# Patient Record
Sex: Female | Born: 1985 | Race: White | Hispanic: No | Marital: Married | Smoking: Former smoker
Health system: Southern US, Community
[De-identification: ages and names within clinical notes are randomized; demographics above are authoritative.]

## PROBLEM LIST (undated history)

## (undated) ENCOUNTER — Inpatient Hospital Stay (HOSPITAL_COMMUNITY): Payer: Self-pay

## (undated) DIAGNOSIS — E282 Polycystic ovarian syndrome: Secondary | ICD-10-CM

## (undated) DIAGNOSIS — IMO0002 Reserved for concepts with insufficient information to code with codable children: Secondary | ICD-10-CM

## (undated) DIAGNOSIS — F329 Major depressive disorder, single episode, unspecified: Secondary | ICD-10-CM

## (undated) DIAGNOSIS — T8859XA Other complications of anesthesia, initial encounter: Secondary | ICD-10-CM

## (undated) DIAGNOSIS — J45990 Exercise induced bronchospasm: Secondary | ICD-10-CM

## (undated) DIAGNOSIS — Z98891 History of uterine scar from previous surgery: Secondary | ICD-10-CM

## (undated) DIAGNOSIS — R87629 Unspecified abnormal cytological findings in specimens from vagina: Secondary | ICD-10-CM

## (undated) DIAGNOSIS — F32A Depression, unspecified: Secondary | ICD-10-CM

## (undated) DIAGNOSIS — T4145XA Adverse effect of unspecified anesthetic, initial encounter: Secondary | ICD-10-CM

## (undated) DIAGNOSIS — N83209 Unspecified ovarian cyst, unspecified side: Secondary | ICD-10-CM

## (undated) DIAGNOSIS — F419 Anxiety disorder, unspecified: Secondary | ICD-10-CM

## (undated) HISTORY — DX: Unspecified ovarian cyst, unspecified side: N83.209

## (undated) HISTORY — DX: Anxiety disorder, unspecified: F41.9

## (undated) HISTORY — DX: Major depressive disorder, single episode, unspecified: F32.9

## (undated) HISTORY — DX: Depression, unspecified: F32.A

## (undated) HISTORY — DX: Reserved for concepts with insufficient information to code with codable children: IMO0002

## (undated) HISTORY — DX: Polycystic ovarian syndrome: E28.2

---
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
## 2004-09-19 DIAGNOSIS — R87619 Unspecified abnormal cytological findings in specimens from cervix uteri: Secondary | ICD-10-CM

## 2004-09-19 DIAGNOSIS — IMO0002 Reserved for concepts with insufficient information to code with codable children: Secondary | ICD-10-CM

## 2004-09-19 HISTORY — DX: Unspecified abnormal cytological findings in specimens from cervix uteri: R87.619

## 2004-09-19 HISTORY — DX: Reserved for concepts with insufficient information to code with codable children: IMO0002

## 2004-09-19 HISTORY — PX: COLPOSCOPY: SHX161

## 2009-09-19 DIAGNOSIS — E282 Polycystic ovarian syndrome: Secondary | ICD-10-CM

## 2009-09-19 HISTORY — DX: Polycystic ovarian syndrome: E28.2

## 2010-09-19 DIAGNOSIS — N83209 Unspecified ovarian cyst, unspecified side: Secondary | ICD-10-CM

## 2010-09-19 HISTORY — DX: Unspecified ovarian cyst, unspecified side: N83.209

## 2011-11-30 ENCOUNTER — Other Ambulatory Visit: Payer: Self-pay | Admitting: Family Medicine

## 2011-11-30 DIAGNOSIS — G43909 Migraine, unspecified, not intractable, without status migrainosus: Secondary | ICD-10-CM

## 2011-12-01 ENCOUNTER — Other Ambulatory Visit: Payer: Self-pay

## 2011-12-02 ENCOUNTER — Ambulatory Visit
Admission: RE | Admit: 2011-12-02 | Discharge: 2011-12-02 | Disposition: A | Discharge status: Home / Self Care | Payer: BC Managed Care – PPO | Source: Ambulatory Visit | Attending: Family Medicine | Admitting: Family Medicine

## 2011-12-02 DIAGNOSIS — G43909 Migraine, unspecified, not intractable, without status migrainosus: Secondary | ICD-10-CM

## 2012-10-15 IMAGING — CT CT HEAD W/O CM
2 series · 16 of 30 positions shown, 20 images · non-contrast
Comparison: None.

CLINICAL DATA: Headache.  Left retro-orbital pain.  Vertigo.
Blurred vision.

CT HEAD WITHOUT CONTRAST
TECHNIQUE: Contiguous axial images were obtained from the base of
the skull through the vertex without contrast.

[Series 3: head bone · axial · 0.49mm/px · z∈[+37,+79]mm · 3 of 28 slices shown]
[im 2/28  bone]
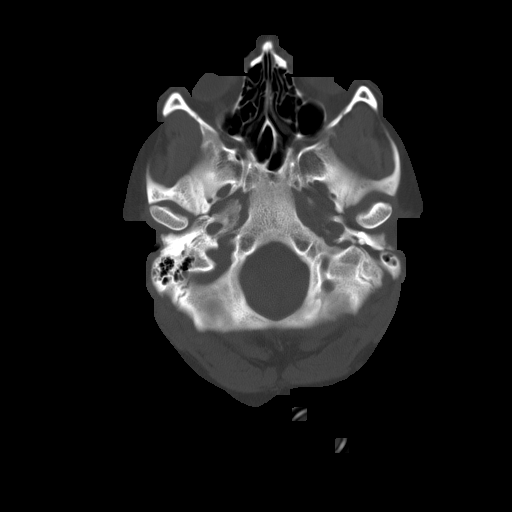
[im 6/28  bone]
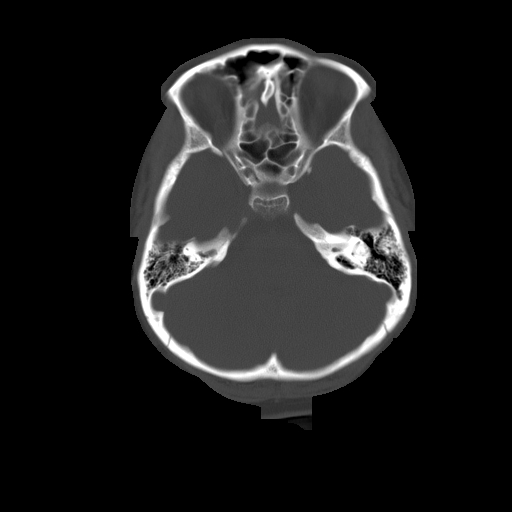
[im 10/28  bone]
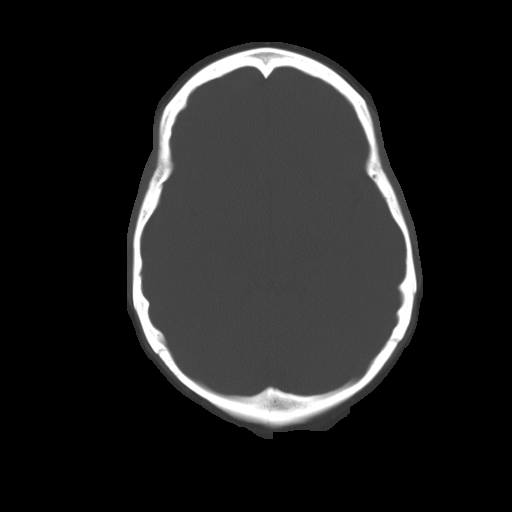

[Series 32: 3d filtered head w/o · axial · non-contrast · 0.49mm/px · z∈[+37,+164]mm · 13 of 28 slices shown, 17 images]
[im 2/28  brain]
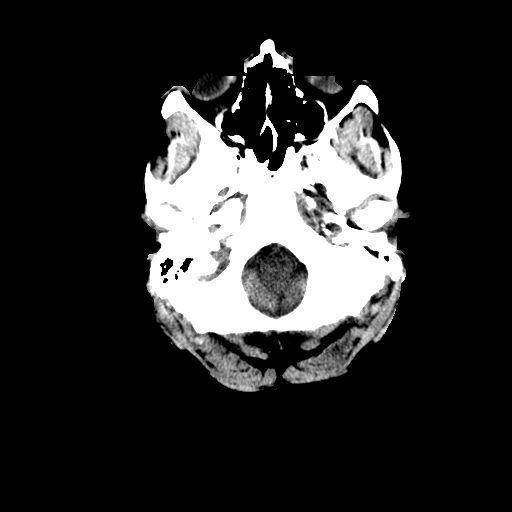
[im 2/28  bone]
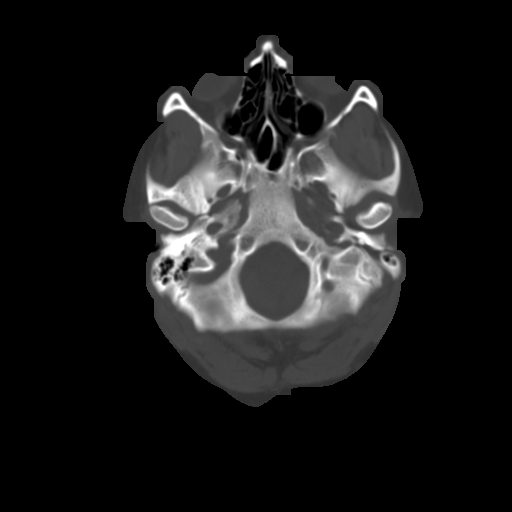
[im 4/28  brain]
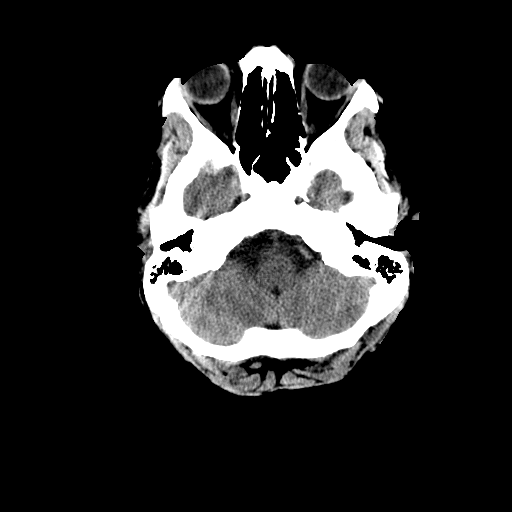
[im 6/28  brain]
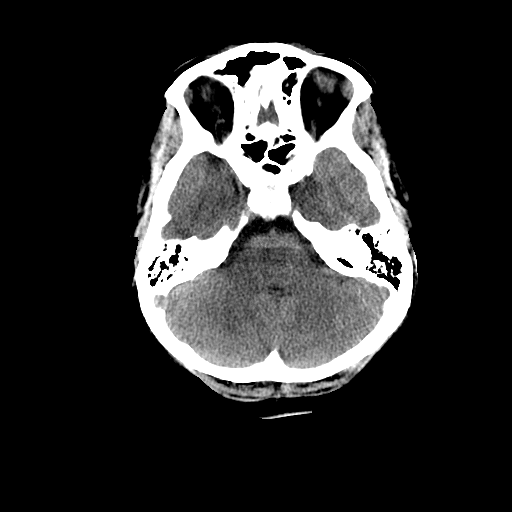
[im 8/28  brain]
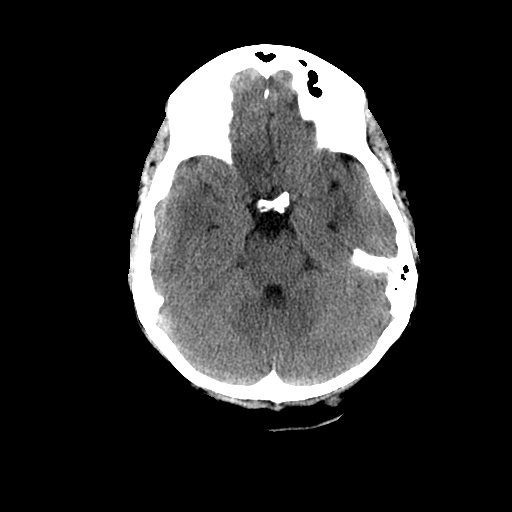
[im 10/28  brain]
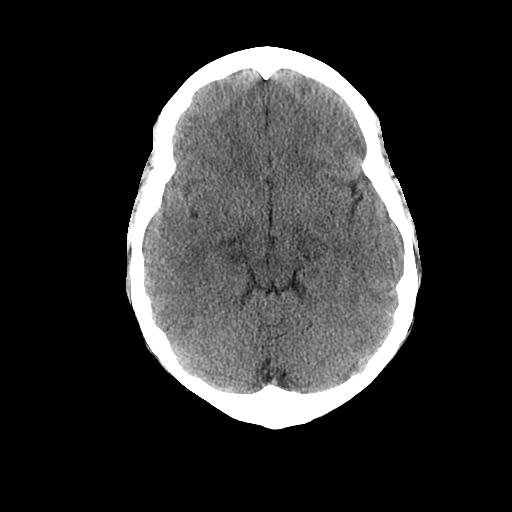
[im 10/28  bone]
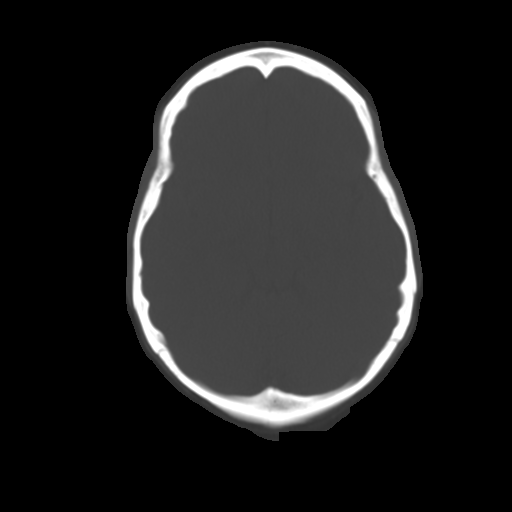
[im 12/28  brain]
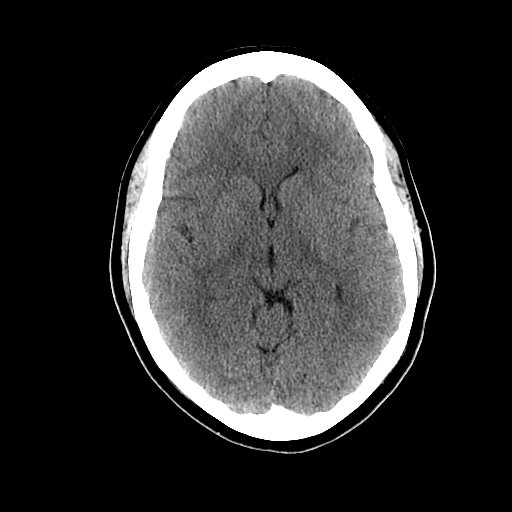
[im 14/28  brain]
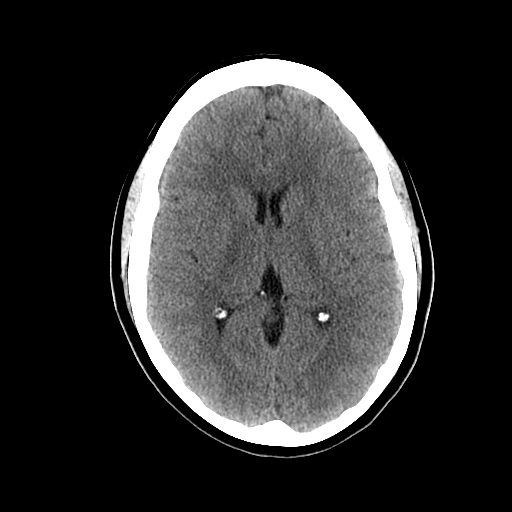
[im 16/28  brain]
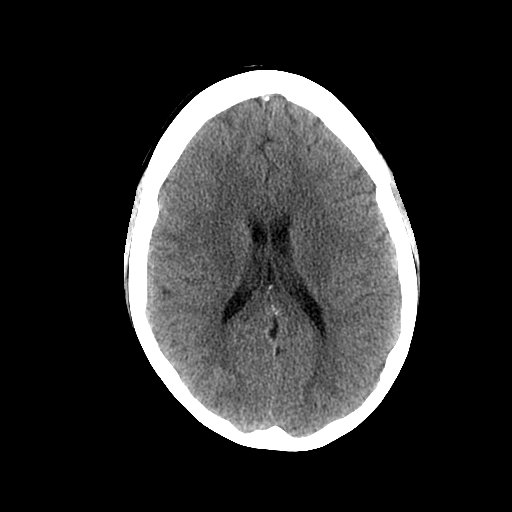
[im 18/28  brain]
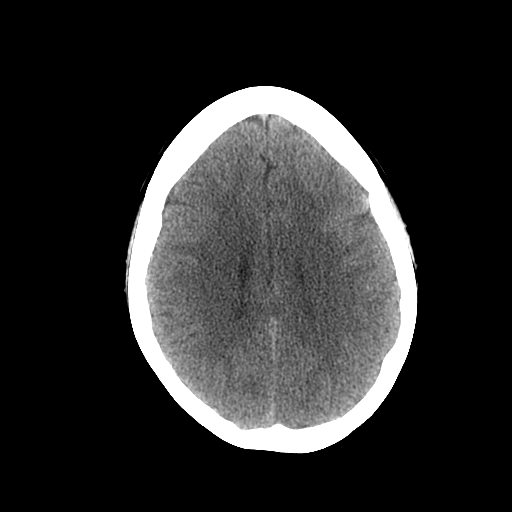
[im 18/28  bone]
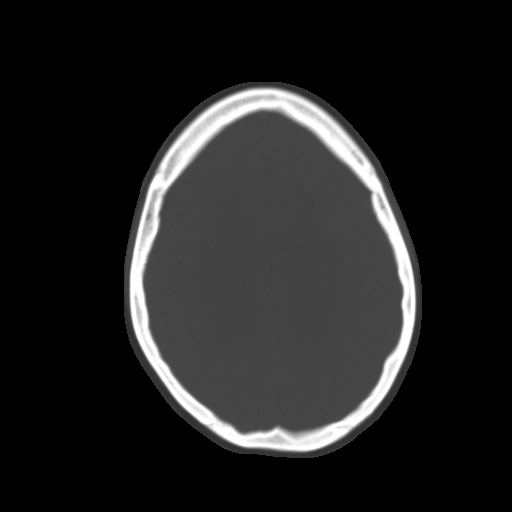
[im 20/28  brain]
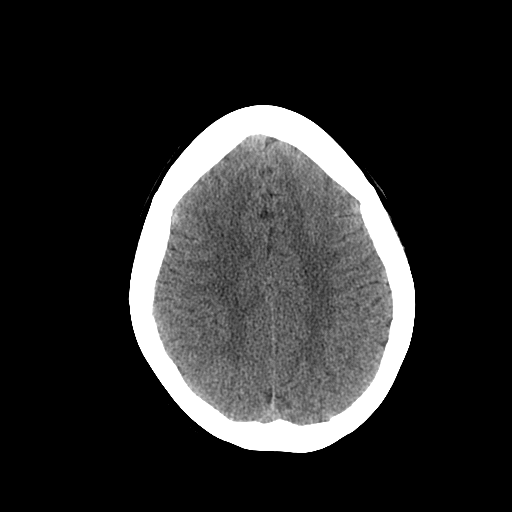
[im 22/28  brain]
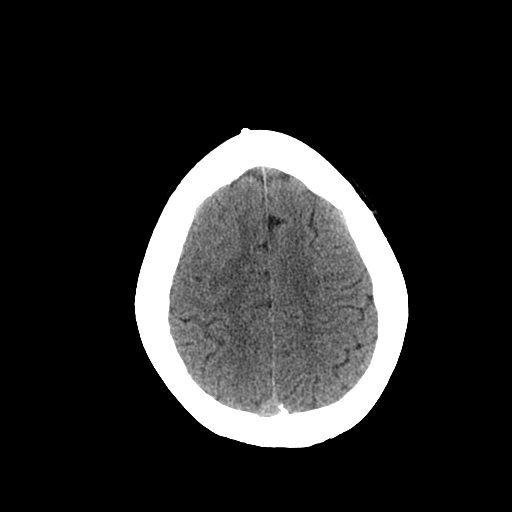
[im 24/28  brain]
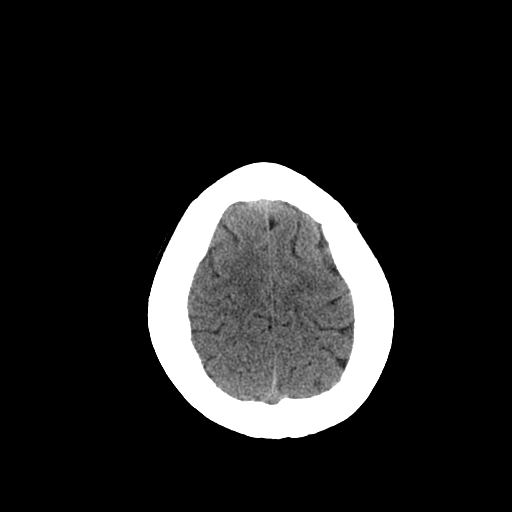
[im 26/28  brain]
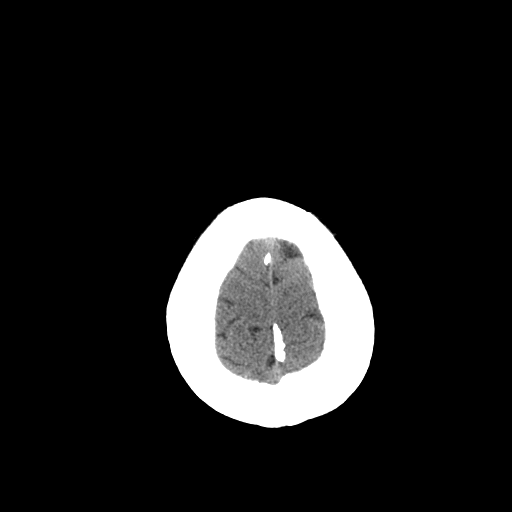
[im 26/28  bone]
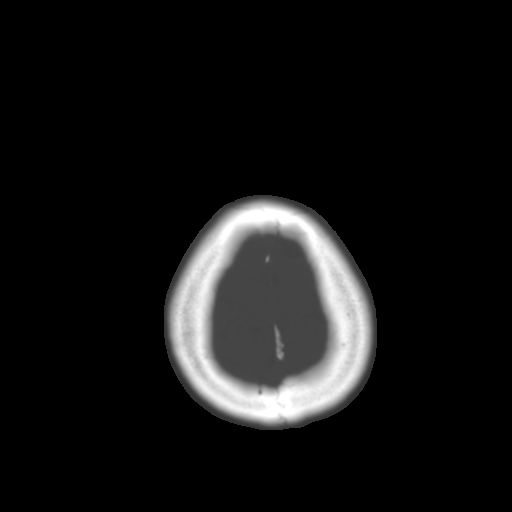

[16 of 30 positions shown; findings below may reference images not displayed]

FINDINGS: The brain has a normal appearance without evidence of
malformation, atrophy, old or acute infarction, mass lesion,
hemorrhage, hydrocephalus or extra-axial collection.  The calvarium
is unremarkable.  Sinuses, middle ears and mastoids are clear.  No
orbital region abnormality is seen.
IMPRESSION: Normal head CT

## 2013-06-21 ENCOUNTER — Telehealth: Payer: Self-pay | Admitting: Emergency Medicine

## 2013-06-21 NOTE — Telephone Encounter (Signed)
Patient calling to schedule New GYN appointment as she is new to the area. Transferred to this RN from reception as patient c/o ongoing vaginal bleeding as reason for visit.   She has a hx of PCOS and is currently trying to conceive with husband. States that since 05/21/13 she has been having spotting. Not heavy at all per patient and is "more of an annoyance", using no more than a panty liner daily.  Denies any contraception use.  Last normal period patient states mid-august but is unsure. Patient states that there is "no chance of pregnancy at all" because has not been sexually active because of the spotting. Advised to complete home pregnancy test just to ensure.   Provided multiple options for new patient appointments and patient declines earlier office visit.  Office visit scheduled per patients date request. Advised to call back if worsens or decides she would like to be seen earlier. Patient agreeable.   Routing to provider for review.

## 2013-07-11 ENCOUNTER — Encounter: Payer: Self-pay | Admitting: Nurse Practitioner

## 2013-07-11 ENCOUNTER — Ambulatory Visit (INDEPENDENT_AMBULATORY_CARE_PROVIDER_SITE_OTHER): Payer: BC Managed Care – PPO | Admitting: Nurse Practitioner

## 2013-07-11 VITALS — BP 110/62 | HR 80 | Resp 16 | Ht 68.5 in | Wt 266.0 lb

## 2013-07-11 DIAGNOSIS — Z01419 Encounter for gynecological examination (general) (routine) without abnormal findings: Secondary | ICD-10-CM

## 2013-07-11 DIAGNOSIS — E282 Polycystic ovarian syndrome: Secondary | ICD-10-CM

## 2013-07-11 DIAGNOSIS — N938 Other specified abnormal uterine and vaginal bleeding: Secondary | ICD-10-CM

## 2013-07-11 DIAGNOSIS — Z0000 Encounter for general adult medical examination without abnormal findings: Secondary | ICD-10-CM

## 2013-07-11 DIAGNOSIS — N949 Unspecified condition associated with female genital organs and menstrual cycle: Secondary | ICD-10-CM

## 2013-07-11 LAB — HEMOGLOBIN, FINGERSTICK: Hemoglobin, fingerstick: 12.3 g/dL (ref 12.0–16.0)

## 2013-07-11 MED ORDER — ESCITALOPRAM OXALATE 20 MG PO TABS: 20.0000 mg | ORAL_TABLET | Freq: Every day | ORAL | Status: DC

## 2013-07-11 MED ORDER — METFORMIN HCL 500 MG PO TABS: 500.0000 mg | ORAL_TABLET | Freq: Two times a day (BID) | ORAL | Status: DC

## 2013-07-11 MED ORDER — DROSPIRENONE-ETHINYL ESTRADIOL 3-0.03 MG PO TABS: 1.0000 | ORAL_TABLET | Freq: Every day | ORAL | Status: DC

## 2013-07-11 NOTE — Progress Notes (Signed)
Patient ID: Katie Dorsey, female   DOB: Dec 14, 1985, 27 y.o.   MRN: [id number] 27 y.o. G0P0 Married Caucasian Fe here for annual exam.history of PCOS with usual cycles 3-4 times a year. Previous OCP with normal periods. For abut 4-5 years.    LMP 04/12/13 X 7 days of just spotting.  Then no cycle until 05/23/13 with spotting the entire month until regular cycle started Oct. 3 - 21 st. This cycle had cramps, heavy bleeding and PMS. No method of birth control and wants to get pregnant. She is not ready to go for infertility evaluation  Patient's last menstrual period was 06/21/2013.          Sexually active: yes  The current method of family planning is none.    Exercising: yes  Home exercise routine includes walking .75 hrs per day. 2-3 times per week Smoker:  Former, quit in February   Health Maintenance: Pap:  Greater than 1 year, mild dysplasia in 2006 TDaP:  2006 Labs: HB: 12.3 Urine: negative, pH 6.0      UPT: negative   reports that she quit smoking about 8 months ago. Her smoking use included Cigarettes. She has a 3.25 pack-year smoking history. She has never used smokeless tobacco. She reports that she does not drink alcohol or use illicit drugs.  Past Medical History  Diagnosis Date  . PCOS (polycystic ovarian syndrome)     Past Surgical History  Procedure Laterality Date  . Tonsillectomy  1995  . Ganglion cyst excision Right 2004    wrist    Current Outpatient Prescriptions  Medication Sig Dispense Refill  . escitalopram (LEXAPRO) 20 MG tablet Take 20 mg by mouth daily.       No current facility-administered medications for this visit.    Family History  Problem Relation Age of Onset  . Hypertension Mother   . HIV Father   . Colon cancer Maternal Grandmother   . Heart disease Maternal Grandfather   . Diabetes Maternal Uncle   . Diabetes Maternal Uncle     ROS:  Pertinent items are noted in HPI.  Otherwise, a comprehensive ROS was negative.  Exam:   BP 110/62  Pulse  80  Resp 16  Ht 5' 8.5" (1.74 m)  Wt 266 lb (120.657 kg)  BMI 39.85 kg/m2  LMP 06/21/2013 Height: 5' 8.5" (174 cm)  Ht Readings from Last 3 Encounters:  07/11/13 5' 8.5" (1.74 m)    General appearance: alert, cooperative and appears stated age Head: Normocephalic, without obvious abnormality, atraumatic Neck: no adenopathy, supple, symmetrical, trachea midline and thyroid normal to inspection and palpation Lungs: clear to auscultation bilaterally Breasts: normal appearance, no masses or tenderness Heart: regular rate and rhythm Abdomen: soft, non-tender; no masses,  no organomegaly Extremities: extremities normal, atraumatic, no cyanosis or edema Skin: Skin color, texture, turgor normal. No rashes or lesions Lymph nodes: Cervical, supraclavicular, and axillary nodes normal. No abnormal inguinal nodes palpated Neurologic: Grossly normal   Pelvic: External genitalia:  no lesions              Urethra:  normal appearing urethra with no masses, tenderness or lesions              Bartholin's and Skene's: normal                 Vagina: normal appearing vagina with normal color and discharge, no lesions              Cervix: anteverted  Pap taken: yes Bimanual Exam:  Uterus:  normal size, contour, position, consistency, mobility, non-tender              Adnexa: no mass, fullness, tenderness               Rectovaginal: Confirms               Anus:  normal sphincter tone, no lesions  A:  Well Woman with normal exam  History of PCOS   History of DUB for 2 months  Obesity  Situational anxiety and depression    P:   Pap smear as per guidelines  Will schedule some fasting labs  Schedule PUS after discussion with Dr. Rica Koyanagi on [place] today and take as directed to initiate regular menses for several months and then consider pregnancy.  She will restart on Metformin after her fasting labs are done - encouraged to take in the evening  Counseled on breast self exam,  adequate intake of calcium and vitamin D, diet and exercise return annually or prn  An After Visit Summary was printed and given to the patient.

## 2013-07-11 NOTE — Patient Instructions (Addendum)
General topics  Next pap or exam is  due in 1 year Take a Women's multivitamin Take 1200 mg. of calcium daily - prefer dietary If any concerns in interim to call back  Breast Self-Awareness Practicing breast self-awareness may pick up problems early, prevent significant medical complications, and possibly save your life. By practicing breast self-awareness, you can become familiar with how your breasts look and feel and if your breasts are changing. This allows you to notice changes early. It can also offer you some reassurance that your breast health is good. One way to learn what is normal for your breasts and whether your breasts are changing is to do a breast self-exam. If you find a lump or something that was not present in the past, it is best to contact your caregiver right away. Other findings that should be evaluated by your caregiver include nipple discharge, especially if it is bloody; skin changes or reddening; areas where the skin seems to be pulled in (retracted); or new lumps and bumps. Breast pain is seldom associated with cancer (malignancy), but should also be evaluated by a caregiver. BREAST SELF-EXAM The best time to examine your breasts is 5 7 days after your menstrual period is over.  ExitCare Patient Information 2013 Exit[employer].   Exercise to Stay Healthy Exercise helps you become and stay healthy. EXERCISE IDEAS AND TIPS Choose exercises that:  You enjoy.  Fit into your day. You do not need to exercise really hard to be healthy. You can do exercises at a slow or medium level and stay healthy. You can:  Stretch before and after working out.  Try yoga, Pilates, or tai chi.  Lift weights.  Walk fast, swim, jog, run, climb stairs, bicycle, dance, or rollerskate.  Take aerobic classes. Exercises that burn about 150 calories:  Running 1  miles in 15 minutes.  Playing volleyball for 45 to 60 minutes.  Washing and waxing a car for 45 to 60  minutes.  Playing touch football for 45 minutes.  Walking 1  miles in 35 minutes.  Pushing a stroller 1  miles in 30 minutes.  Playing basketball for 30 minutes.  Raking leaves for 30 minutes.  Bicycling 5 miles in 30 minutes.  Walking 2 miles in 30 minutes.  Dancing for 30 minutes.  Shoveling snow for 15 minutes.  Swimming laps for 20 minutes.  Walking up stairs for 15 minutes.  Bicycling 4 miles in 15 minutes.  Gardening for 30 to 45 minutes.  Jumping rope for 15 minutes.  Washing windows or floors for 45 to 60 minutes. Document Released: 10/08/2010 Document Revised: 11/28/2011 Document Reviewed: 10/08/2010 ExitCare Patient Information 2013 Exit[employer].   Other topics ( that may be useful information):    Sexually Transmitted Disease Sexually transmitted disease (STD) refers to any infection that is passed from person to person during sexual activity. This may happen by way of saliva, semen, blood, vaginal mucus, or urine. Common STDs include:  Gonorrhea.  Chlamydia.  Syphilis.  HIV/AIDS.  Genital herpes.  Hepatitis B and C.  Trichomonas.  Human papillomavirus (HPV).  Pubic lice. CAUSES  An STD may be spread by bacteria, virus, or parasite. A person can get an STD by:  Sexual intercourse with an infected person.  Sharing sex toys with an infected person.  Sharing needles with an infected person.  Having intimate contact with the genitals, mouth, or rectal areas of an infected person. SYMPTOMS  Some people may not have any symptoms, but   they can still pass the infection to others. Different STDs have different symptoms. Symptoms include:  Painful or bloody urination.  Pain in the pelvis, abdomen, vagina, anus, throat, or eyes.  Skin rash, itching, irritation, growths, or sores (lesions). These usually occur in the genital or anal area.  Abnormal vaginal discharge.  Penile discharge in men.  Soft, flesh-colored skin growths in the  genital or anal area.  Fever.  Pain or bleeding during sexual intercourse.  Swollen glands in the groin area.  Yellow skin and eyes (jaundice). This is seen with hepatitis. DIAGNOSIS  To make a diagnosis, your caregiver may:  Take a medical history.  Perform a physical exam.  Take a specimen (culture) to be examined.  Examine a sample of discharge under a microscope.  Perform blood test TREATMENT   Chlamydia, gonorrhea, trichomonas, and syphilis can be cured with antibiotic medicine.  Genital herpes, hepatitis, and HIV can be treated, but not cured, with prescribed medicines. The medicines will lessen the symptoms.  Genital warts from HPV can be treated with medicine or by freezing, burning (electrocautery), or surgery. Warts may come back.  HPV is a virus and cannot be cured with medicine or surgery.However, abnormal areas may be followed very closely by your caregiver and may be removed from the cervix, vagina, or vulva through office procedures or surgery. If your diagnosis is confirmed, your recent sexual partners need treatment. This is true even if they are symptom-free or have a negative culture or evaluation. They should not have sex until their caregiver says it is okay. HOME CARE INSTRUCTIONS  All sexual partners should be informed, tested, and treated for all STDs.  Take your antibiotics as directed. Finish them even if you start to feel better.  Only take over-the-counter or prescription medicines for pain, discomfort, or fever as directed by your caregiver.  Rest.  Eat a balanced diet and drink enough fluids to keep your urine clear or pale yellow.  Do not have sex until treatment is completed and you have followed up with your caregiver. STDs should be checked after treatment.  Keep all follow-up appointments, Pap tests, and blood tests as directed by your caregiver.  Only use latex condoms and water-soluble lubricants during sexual activity. Do not use  petroleum jelly or oils.  Avoid alcohol and illegal drugs.  Get vaccinated for HPV and hepatitis. If you have not received these vaccines in the past, talk to your caregiver about whether one or both might be right for you.  Avoid risky sex practices that can break the skin. The only way to avoid getting an STD is to avoid all sexual activity.Latex condoms and dental dams (for oral sex) will help lessen the risk of getting an STD, but will not completely eliminate the risk. SEEK MEDICAL CARE IF:   You have a fever.  You have any new or worsening symptoms. Document Released: 11/26/2002 Document Revised: 11/28/2011 Document Reviewed: 12/03/2010 [facility] Patient Information 2013 [place].    Domestic Abuse You are being battered or abused if someone close to you hits, pushes, or physically hurts you in any way. You also are being abused if you are forced into activities. You are being sexually abused if you are forced to have sexual contact of any kind. You are being emotionally abused if you are made to feel worthless or if you are constantly threatened. It is important to remember that help is available. No one has the right to abuse you. PREVENTION OF FURTHER  ABUSE  Learn the warning signs of danger. This varies with situations but may include: the use of alcohol, threats, isolation from friends and family, or forced sexual contact. Leave if you feel that violence is going to occur.  If you are attacked or beaten, report it to the police so the abuse is documented. You do not have to press charges. The police can protect you while you or the attackers are leaving. Get the officer's name and badge number and a copy of the report.  Find someone you can trust and tell them what is happening to you: your caregiver, a nurse, clergy member, close friend or family member. Feeling ashamed is natural, but remember that you have done nothing wrong. No one deserves abuse. Document Released:  09/02/2000 Document Revised: 11/28/2011 Document Reviewed: 11/11/2010 ExitCare Patient Information 2013 ExitCare[employer].    How Much is Too Much Alcohol? Drinking too much alcohol can cause injury, accidents, and health problems. These types of problems can include:   Car crashes.  Falls.  Family fighting (domestic violence).  Drowning.  Fights.  Injuries.  Burns.  Damage to certain organs.  Having a baby with birth defects. ONE DRINK CAN BE TOO MUCH WHEN YOU ARE:  Working.  Pregnant or breastfeeding.  Taking medicines. Ask your doctor.  Driving or planning to drive. If you or someone you know has a drinking problem, get help from a doctor.  Document Released: 07/02/2009 Document Revised: 11/28/2011 Document Reviewed: 07/02/2009 ExitCare Patient Information 2013 ExitCare[employer].   Smoking Hazards Smoking cigarettes is extremely bad for your health. Tobacco smoke has over 200 known poisons in it. There are over 60 chemicals in tobacco smoke that cause cancer. Some of the chemicals found in cigarette smoke include:   Cyanide.  Benzene.  Formaldehyde.  Methanol (wood alcohol).  Acetylene (fuel used in welding torches).  Ammonia. Cigarette smoke also contains the poisonous gases nitrogen oxide and carbon monoxide.  Cigarette smokers have an increased risk of many serious medical problems and Smoking causes approximately:  90% of all lung cancer deaths in men.  80% of all lung cancer deaths in women.  90% of deaths from chronic obstructive lung disease. Compared with nonsmokers, smoking increases the risk of:  Coronary heart disease by 2 to 4 times.  Stroke by 2 to 4 times.  Men developing lung cancer by 23 times.  Women developing lung cancer by 13 times.  Dying from chronic obstructive lung diseases by 12 times.  . Smoking is the most preventable cause of death and disease in our society.  WHY IS SMOKING ADDICTIVE?  Nicotine is the chemical  agent in tobacco that is capable of causing addiction or dependence.  When you smoke and inhale, nicotine is absorbed rapidly into the bloodstream through your lungs. Nicotine absorbed through the lungs is capable of creating a powerful addiction. Both inhaled and non-inhaled nicotine may be addictive.  Addiction studies of cigarettes and spit tobacco show that addiction to nicotine occurs mainly during the teen years, when young people begin using tobacco products. WHAT ARE THE BENEFITS OF QUITTING?  There are many health benefits to quitting smoking.   Likelihood of developing cancer and heart disease decreases. Health improvements are seen almost immediately.  Blood pressure, pulse rate, and breathing patterns start returning to normal soon after quitting. QUITTING SMOKING   American Lung Association - 1-800-LUNGUSA  American Cancer Society - 1-800-ACS-2345 Document Released: 10/13/2004 Document Revised: 11/28/2011 Document Reviewed: 06/17/2009 ExitCare Patient Information 2013 ExitCare,   LLC.   Stress Management Stress is a state of physical or mental tension that often results from changes in your life or normal routine. Some common causes of stress are:  Death of a loved one.  Injuries or severe illnesses.  Getting fired or changing jobs.  Moving into a new home. Other causes may be:  Sexual problems.  Business or financial losses.  Taking on a large debt.  Regular conflict with someone at home or at work.  Constant tiredness from lack of sleep. It is not just bad things that are stressful. It may be stressful to:  Win the lottery.  Get married.  Buy a new car. The amount of stress that can be easily tolerated varies from person to person. Changes generally cause stress, regardless of the types of change. Too much stress can affect your health. It may lead to physical or emotional problems. Too little stress (boredom) may also become stressful. SUGGESTIONS TO  REDUCE STRESS:  Talk things over with your family and friends. It often is helpful to share your concerns and worries. If you feel your problem is serious, you may want to get help from a professional counselor.  Consider your problems one at a time instead of lumping them all together. Trying to take care of everything at once may seem impossible. List all the things you need to do and then start with the most important one. Set a goal to accomplish 2 or 3 things each day. If you expect to do too many in a single day you will naturally fail, causing you to feel even more stressed.  Do not use alcohol or drugs to relieve stress. Although you may feel better for a short time, they do not remove the problems that caused the stress. They can also be habit forming.  Exercise regularly - at least 3 times per week. Physical exercise can help to relieve that "uptight" feeling and will relax you.  The shortest distance between despair and hope is often a good night's sleep.  Go to bed and get up on time allowing yourself time for appointments without being rushed.  Take a short "time-out" period from any stressful situation that occurs during the day. Close your eyes and take some deep breaths. Starting with the muscles in your face, tense them, hold it for a few seconds, then relax. Repeat this with the muscles in your neck, shoulders, hand, stomach, back and legs.  Take good care of yourself. Eat a balanced diet and get plenty of rest.  Schedule time for having fun. Take a break from your daily routine to relax. HOME CARE INSTRUCTIONS   Call if you feel overwhelmed by your problems and feel you can no longer manage them on your own.  Return immediately if you feel like hurting yourself or someone else. Document Released: 03/01/2001 Document Revised: 11/28/2011 Document Reviewed: 10/22/2007 [facility] Patient Information 2013 [place], [place].      Polycystic Ovarian Syndrome Polycystic ovarian  syndrome is a condition with a number of problems. One problem is with the ovaries. The ovaries are organs located in the female pelvis, on each side of the uterus. Usually, during the menstrual cycle, an egg is released from 1 ovary every month. This is called ovulation. When the egg is fertilized, it goes into the womb (uterus), which allows for the growth of a baby. The egg travels from the ovary through the fallopian tube to the uterus. The ovaries also make the hormones estrogen and  progesterone. These hormones help the development of a woman's breasts, body shape, and body hair. They also regulate the menstrual cycle and pregnancy. Sometimes, cysts form in the ovaries. A cyst is a fluid-filled sac. On the ovary, different types of cysts can form. The most common type of ovarian cyst is called a functional or ovulation cyst. It is normal, and often forms during the normal menstrual cycle. Each month, a woman's ovaries grow tiny cysts that hold the eggs. When an egg is fully grown, the sac breaks open. This releases the egg. Then, the sac which released the egg from the ovary dissolves. In one type of functional cyst, called a follicle cyst, the sac does not break open to release the egg. It may actually continue to grow. This type of cyst usually disappears within 1 to 3 months.  One type of cyst problem with the ovaries is called Polycystic Ovarian Syndrome (PCOS). In this condition, many follicle cysts form, but do not rupture and produce an egg. This health problem can affect the following:  Menstrual cycle.  Heart.  Obesity.  Cancer of the uterus.  Fertility.  Blood vessels.  Hair growth (face and body) or baldness.  Hormones.  Appearance.  High blood pressure.  Stroke.  Insulin production.  Inflammation of the liver.  Elevated blood cholesterol and triglycerides. CAUSES   No one knows the exact cause of PCOS.  Women with PCOS often have a mother or sister with PCOS. There  is not yet enough proof to say this is inherited.  Many women with PCOS have a weight problem.  Researchers are looking at the relationship between PCOS and the body's ability to make insulin. Insulin is a hormone that regulates the change of sugar, starches, and other food into energy for the body's use, or for storage. Some women with PCOS make too much insulin. It is possible that the ovaries react by making too many female hormones, called androgens. This can lead to acne, excessive hair growth, weight gain, and ovulation problems.  Too much production of luteinizing hormone (LH) from the pituitary gland in the brain stimulates the ovary to produce too much female hormone (androgen). SYMPTOMS   Infrequent or no menstrual periods, and/or irregular bleeding.  Inability to get pregnant (infertility), because of not ovulating.  Increased growth of hair on the face, chest, stomach, back, thumbs, thighs, or toes.  Acne, oily skin, or dandruff.  Pelvic pain.  Weight gain or obesity, usually carrying extra weight around the waist.  Type 2 diabetes (this is the diabetes that usually does not need insulin).  High cholesterol.  High blood pressure.  Female-pattern baldness or thinning hair.  Patches of thickened and dark brown or black skin on the neck, arms, breasts, or thighs.  Skin tags, or tiny excess flaps of skin, in the armpits or neck area.  Sleep apnea (excessive snoring and breathing stops at times while asleep).  Deepening of the voice.  Gestational diabetes when pregnant.  Increased risk of miscarriage with pregnancy. DIAGNOSIS  There is no single test to diagnose PCOS.   Your caregiver will:  Take a medical history.  Perform a pelvic exam.  Perform an ultrasound.  Check your female and female hormone levels.  Measure glucose or sugar levels in the blood.  Do other blood tests.  If you are producing too many female hormones, your caregiver will make sure it is from  PCOS. At the physical exam, your caregiver will want to evaluate the areas  of increased hair growth. Try to allow natural hair growth for a few days before the visit.  During a pelvic exam, the ovaries may be enlarged or swollen by the increased number of small cysts. This can be seen more easily by vaginal ultrasound or screening, to examine the ovaries and lining of the uterus (endometrium) for cysts. The uterine lining may become thicker, if there has not been a regular period. TREATMENT  Because there is no cure for PCOS, it needs to be managed to prevent problems. Treatments are based on your symptoms. Treatment is also based on whether you want to have a baby or whether you need contraception.  Treatment may include:  Progesterone hormone, to start a menstrual period.  Birth control pills, to make you have regular menstrual periods.  Medicines to make you ovulate, if you want to get pregnant.  Medicines to control your insulin.  Medicine to control your blood pressure.  Medicine and diet, to control your high cholesterol and triglycerides in your blood.  Surgery, making small holes in the ovary, to decrease the amount of female hormone production. This is done through a long, lighted tube (laparoscope), placed into the pelvis through a tiny incision in the lower abdomen. Your caregiver will go over some of the choices with you. WOMEN WITH PCOS HAVE THESE CHARACTERISTICS:  High levels of female hormones called androgens.  An irregular or no menstrual cycle.  May have many small cysts in their ovaries. PCOS is the most common hormonal reproductive problem in women of childbearing age. WHY DO WOMEN WITH PCOS HAVE TROUBLE WITH THEIR MENSTRUAL CYCLE? Each month, about 20 eggs start to mature in the ovaries. As one egg grows and matures, the follicle breaks open to release the egg, so it can travel through the fallopian tube for fertilization. When the single egg leaves the follicle,  ovulation takes place. In women with PCOS, the ovary does not make all of the hormones it needs for any of the eggs to fully mature. They may start to grow and accumulate fluid, but no one egg becomes large enough. Instead, some may remain as cysts. Since no egg matures or is released, ovulation does not occur and the hormone progesterone is not made. Without progesterone, a woman's menstrual cycle is irregular or absent. Also, the cysts produce female hormones, which continue to prevent ovulation.  Document Released: 12/30/2004 Document Revised: 11/28/2011 Document Reviewed: 07/24/2009 [facility] Patient Information 2014 [place], [place].

## 2013-07-12 ENCOUNTER — Telehealth: Payer: Self-pay | Admitting: Nurse Practitioner

## 2013-07-12 ENCOUNTER — Encounter: Payer: Self-pay | Admitting: Nurse Practitioner

## 2013-07-12 NOTE — Telephone Encounter (Signed)
Left patient a message that we are going to go ahead with PUS and to expect a call from a nurse to schedule.

## 2013-07-12 NOTE — Telephone Encounter (Signed)
LMTCB to discuss ins benefits and schedule PUS.  °

## 2013-07-14 NOTE — Progress Notes (Signed)
Encounter reviewed by Dr. Brook Silva.  

## 2013-07-16 NOTE — Telephone Encounter (Signed)
Spoke with patient in regards benefits for PUS. Patient is unable to afford this procedure ($400+). She would like to know if there are any other options? She would also like to see if she could set up a payment plan. I advised her of our policy and that I would check with Patty and administration.

## 2013-07-17 ENCOUNTER — Other Ambulatory Visit: Payer: Self-pay | Admitting: Nurse Practitioner

## 2013-07-17 ENCOUNTER — Other Ambulatory Visit (INDEPENDENT_AMBULATORY_CARE_PROVIDER_SITE_OTHER): Payer: BC Managed Care – PPO

## 2013-07-17 DIAGNOSIS — E282 Polycystic ovarian syndrome: Secondary | ICD-10-CM

## 2013-07-17 LAB — LIPID PANEL
Cholesterol: 147 mg/dL (ref 0–200)
HDL: 46 mg/dL (ref >39)
LDL Cholesterol: 77 mg/dL (ref 0–99)
Total CHOL/HDL Ratio: 3.2 Ratio
Triglycerides: 121 mg/dL (ref <150)
VLDL: 24 mg/dL (ref 0–40)

## 2013-07-17 LAB — COMPREHENSIVE METABOLIC PANEL
ALT: 19 U/L (ref 0–35)
AST: 15 U/L (ref 0–37)
Alkaline Phosphatase: 92 U/L (ref 39–117)
Sodium: 136 mEq/L (ref 135–145)
Total Bilirubin: 0.4 mg/dL (ref 0.3–1.2)
Total Protein: 6.8 g/dL (ref 6.0–8.3)

## 2013-07-21 LAB — 17-HYDROXYPROGESTERONE: 17-OH-Progesterone, LC/MS/MS: 36 ng/dL

## 2013-07-23 ENCOUNTER — Telehealth: Payer: Self-pay | Admitting: *Deleted

## 2013-07-23 NOTE — Telephone Encounter (Signed)
Patient returning Stephanie's call.

## 2013-07-23 NOTE — Telephone Encounter (Signed)
Message copied by Luisa Dago on Tue Jul 23, 2013  1:11 PM ------      Message from: Ria Comment R      Created: Mon Jul 22, 2013 10:25 AM       Let patient know final test results with 17-OH Progesterone level is normal.  Continue as planned with PUS. ------

## 2013-07-23 NOTE — Telephone Encounter (Signed)
I have attempted to contact this patient by phone with the following results: left message to return my call on answering machine (home).  

## 2013-07-23 NOTE — Telephone Encounter (Signed)
Pt returned call and left message. I have attempted to contact this patient by phone with the following results: left message to return my call on answering machine (home).

## 2013-07-24 NOTE — Telephone Encounter (Signed)
Pt notified in result letter.

## 2013-07-29 NOTE — Telephone Encounter (Signed)
I don't know answer to this so I am referring this back to discuss with Bonita Quin

## 2013-07-29 NOTE — Telephone Encounter (Signed)
She said she would only be able to afford $100 up front (at most) then $30-$40/month. I did not think this would be a valid payment plan.

## 2013-07-30 NOTE — Telephone Encounter (Signed)
Spoke with Bonita Quin in regards to the payment plan that the patient had proposed. Bonita Quin states it would not be a feasible payment plan since it would take a year for the patient to pay for a pelvic ultrasound.  Dr. Hyacinth Meeker, please advise on how I should follow up with this patient. Dx: [phone]

## 2013-07-30 NOTE — Telephone Encounter (Signed)
This can be scheduled at [facility].  [name] or French Ana can do that.

## 2013-07-31 NOTE — Telephone Encounter (Signed)
I spoke with patient and she would like to have u/s done here in the office. She states that she is going to try to save up the money and then schedule. She states that after this Monday (08/05/13) that we can call her and she will be ready to schedule.

## 2013-07-31 NOTE — Telephone Encounter (Signed)
Katie Dorsey or [name],   Will you assist with this?

## 2013-07-31 NOTE — Telephone Encounter (Signed)
I spoke with [facility] who suggested to call Billing @ [phone]. Billing Suggested we speak with financial counselor@ [phone] (who is out of office until 08/02/13) I left a message for the financial counselor to return my call. I then spoke with one of the financial counselors on call who suggested that patient make appointment at radiology and then at the time of making appointment, let them know she wants to do a payment plan.

## 2013-08-05 NOTE — Telephone Encounter (Signed)
LMTCB to schedule PUS.  °

## 2013-08-14 NOTE — Telephone Encounter (Signed)
Message left to return call to [place] at [phone] to assist with scheduling pus.

## 2013-08-20 ENCOUNTER — Telehealth: Payer: Self-pay | Admitting: Nurse Practitioner

## 2013-08-20 NOTE — Telephone Encounter (Signed)
LMTCB to schedule PUS.  °

## 2013-08-21 NOTE — Telephone Encounter (Signed)
08/20/13 LMTCB to schedule PUS.

## 2013-08-23 NOTE — Telephone Encounter (Signed)
Multiple calls have been made to this patient to assist with scheduling. She informed French Ana that she wanted to be scheduled in our office instead of being scheduled at the hospital. Since she spoke to [place] i have not been able to reach her. Please advise.

## 2013-08-26 NOTE — Telephone Encounter (Signed)
Per office note, DUB x 2 months.  Ok to remove from work queue.  Can close encounter.

## 2013-08-29 ENCOUNTER — Other Ambulatory Visit: Payer: Self-pay | Admitting: Nurse Practitioner

## 2013-08-30 NOTE — Telephone Encounter (Signed)
Pull paper chart for me to review.

## 2013-08-30 NOTE — Telephone Encounter (Signed)
Refill request, pt was given Lexapro 20mg  #30 x 0 refills on Annual exam 07/11/13. Please advise

## 2013-09-06 ENCOUNTER — Other Ambulatory Visit: Payer: Self-pay | Admitting: Nurse Practitioner

## 2013-11-21 ENCOUNTER — Other Ambulatory Visit: Payer: Self-pay | Admitting: Obstetrics and Gynecology

## 2013-11-21 MED ORDER — ESCITALOPRAM OXALATE 20 MG PO TABS: 20.0000 mg | ORAL_TABLET | Freq: Every day | ORAL | Status: DC

## 2013-11-21 NOTE — Telephone Encounter (Signed)
Last AEX 07/01/2013 Last refill 08/29/2013 #30/0 refills Refill refused 12/19/214.  Please advise.

## 2013-11-21 NOTE — Telephone Encounter (Signed)
Pt is asking if Dr.Silva will give her a 10 day supply of Lexapro. She has an NGYN appointment with a primary doctor but [facility]30 [facility] [employer]Marland Kitchen

## 2013-11-22 NOTE — Telephone Encounter (Signed)
Patient notified rx sent to pharmacy

## 2014-01-09 ENCOUNTER — Encounter: Payer: Self-pay | Admitting: Nurse Practitioner

## 2014-01-20 ENCOUNTER — Encounter: Payer: Self-pay | Admitting: Nurse Practitioner

## 2014-02-06 ENCOUNTER — Telehealth: Payer: Self-pay | Admitting: Nurse Practitioner

## 2014-02-06 NOTE — Telephone Encounter (Signed)
[place] from [place].

## 2014-02-17 ENCOUNTER — Telehealth: Payer: Self-pay | Admitting: Nurse Practitioner

## 2014-02-17 DIAGNOSIS — N912 Amenorrhea, unspecified: Secondary | ICD-10-CM

## 2014-02-17 NOTE — Telephone Encounter (Signed)
Spoke with pt who has taken 4 UPT's and they are positive. Pt's LMP was either 12-28-13 or 01-01-14, she can't remember. Scheduled appt tomorrow at 12:45 with PG.

## 2014-02-17 NOTE — Telephone Encounter (Signed)
Patient is pregnant and would like an appointment for confirmation.

## 2014-02-18 ENCOUNTER — Encounter: Payer: Self-pay | Admitting: Nurse Practitioner

## 2014-02-18 ENCOUNTER — Ambulatory Visit (INDEPENDENT_AMBULATORY_CARE_PROVIDER_SITE_OTHER): Payer: BC Managed Care – PPO | Admitting: Nurse Practitioner

## 2014-02-18 VITALS — BP 126/72 | HR 76 | Ht 68.5 in | Wt 250.0 lb

## 2014-02-18 DIAGNOSIS — N912 Amenorrhea, unspecified: Secondary | ICD-10-CM

## 2014-02-18 LAB — POCT URINE PREGNANCY: PREG TEST UR: POSITIVE

## 2014-02-18 NOTE — Progress Notes (Signed)
  28 y.o.Married Caucasian female presents with her husband to discuss amenorrhea and + UPT at home.  No menses since April 11.  She had been having problems with irregular menses from suspected PCOS.  She was placed on Yasmin for 6 months after her last AEX in October along with Metformin.  She then came off OCP in April.  She then got pregnant with a home UPT + last weekend.  She has come off Lexapro in the interim.   Patient reports no vaginal bleeding or spotting. No pelvic pain.  She did have a few cramps over a week ago but none since .     Pertinent items are noted in HPI. O: No exam performed today, no problems with pain or bleeding.Urine pregnancy is positive.  Normal mood and behavior  Both patient and husband are very pleased about results   Assessment:  Amenorrhea   Plan: Will get PUS for viability  Continue on Prenatal MVI  She will stay off Lexapro  consult time: 15 minutes with patient and husband - face to face

## 2014-02-18 NOTE — Patient Instructions (Signed)
Preparing for Pregnancy Preparing for pregnancy (preconceptual care) by getting counseling and information from your caregiver before getting pregnant is a good idea. It will help you and your baby have a better chance to have a healthy, safe pregnancy and delivery of your baby. Make an appointment with your caregiver to talk about your health, medical, and family history and how to prepare yourself before getting pregnant. Your caregiver will do a complete physical exam and a Pap test. They will want to know:  About you, your spouse or partner, and your family's medical and genetic history.  If you are eating a balanced diet and drinking enough fluids.  What vitamins and mineral supplements you are taking. This includes taking folic acid before getting pregnant to help prevent birth defects.  What medications you are taking including prescription, over-the-counter and herbal medications.  If there is any substance abuse like alcohol, smoking, and illegal drugs.  If there is any mental or physical domestic violence.  If there is any risk of sexually transmitted disease between you and your partner.  What immunizations and vaccinations you have had and what you may need before getting pregnant.  If you should get tested for HIV infection.  If there is any exposure to chemical or toxic substances at home or work.  If there are medical problems you have that need to be treated and kept under control before getting pregnant such as diabetes, high blood pressure or others.  If there were any past surgeries, pregnancies and problems with them.  What your current weight is and to set a goal as to how much weight you should gain while pregnant. Also, they will check if you should lose or gain weight before getting pregnant.  What is your exercise routine and what it is safe when you are pregnant.  If there are any physical disabilities that need to be addressed.  About spacing your  pregnancies when there are other children.  If there is a financial problem that may affect you having a child. After talking about the above points with your caregiver, your caregiver will give you advice on how to help treat and work with you on solving any issues, if necessary, before getting pregnant. The goal is to have a healthy and safe pregnancy for you and your baby. You should keep an accurate record of your menstrual periods because it will help in determining your due date. Immunizations that you should have before getting pregnant:   Regular measles, German measles (rubella) and mumps.  Tetanus and diphtheria.  Chickenpox, if not immune.  Herpes zoster (Varicella) if not immune.  Human papilloma virus vaccine (HPV) between the age of 9 and 26 years old.  Hepatitis A vaccine.  Hepatitis B vaccine.  Influenza vaccine.  Pneumococcal vaccine (pneumonia). You should avoid getting pregnant for one month after getting vaccinated with a live virus vaccine such as German measles (rubella) which is in the MMR (Measles, Mumps and Rubella) vaccine. Other immunizations may be necessary depending on where you live, such as malaria. Ask your caregiver if any other immunizations are needed for you. HOME CARE INSTRUCTIONS   Follow the advice of your caregiver.  Before getting pregnant:  Begin taking vitamins, supplements, and 0.4 milligrams folic acid daily.  Get your immunizations up-to-date.  Get help from a nutrition counselor if you do not understand what a balanced diet is, need help with a special medical diet or if you need help to lose or gain weight.    Begin exercising.  Stop smoking, taking illegal drugs, and drinking alcoholic beverages.  Get counseling if there is and type of domestic violence.  Get checked for sexually transmitted diseases including HIV.  Get any medical problems under control (diabetes, high blood pressure, convulsions, asthma or  others).  Resolve any financial concerns or create a plan to do so.  Be sure you and your spouse or partner are ready to have a baby.  Keep an accurate record of your menstrual periods. Document Released: 08/18/2008 Document Revised: 06/26/2013 Document Reviewed: 08/18/2008 [facility] Patient Information 2014 [place].

## 2014-02-20 NOTE — Telephone Encounter (Signed)
Patient is ready to schedule ultrasound.   Pelvic U/S scheduled and patient aware/agreeable to time, 02/27/14 at 1430 ultrasound and follow up with Dr. Hyacinth Meeker.  Patient verbalized understanding of the U/S appointment cancellation policy. Advised will need to cancel within 72 business hours (3 business days) or will have $100.00 no show fee placed to account.    Advised will need to have ultrasound pre-certed and advised would call patient back if more than office visit cost is request.   Shanda Bumps can you precert? Ordered for MD Provider, Alexia Freestone had recently put in order.

## 2014-02-20 NOTE — Telephone Encounter (Signed)
Pt is calling to see if an appointment has been scheduled for her for an ultrasound yet.

## 2014-02-20 NOTE — Telephone Encounter (Signed)
I called [employer] and precerted the [id number](Found out that patient resp is $408.26). [name] called [employer] yesterday and precerted [phone] and has the same patient amount. I called the patient to let her know what her portion would be and explained that those services would apply to her deductible. She has additional questions regarding maternity benefits that she wants to address to [employer]. She told me that she will call them today or tomorrow and will let us know by Monday if she plans on keeping her appointment on 02/27/14.

## 2014-02-21 NOTE — Telephone Encounter (Signed)
Patient called to cancel her appointment for PUS with Dr. Hyacinth Meeker for 02/27/14. She states she will be continuing care at an OB/GYN instead.

## 2014-02-21 NOTE — Progress Notes (Signed)
Encounter reviewed by Dr. Tierre Gerard Silva.  

## 2014-02-21 NOTE — Telephone Encounter (Signed)
Routing to Taylor for update. Will close encounter.

## 2014-02-27 ENCOUNTER — Other Ambulatory Visit: Payer: BC Managed Care – PPO | Admitting: Obstetrics & Gynecology

## 2014-02-27 ENCOUNTER — Other Ambulatory Visit: Payer: BC Managed Care – PPO

## 2014-03-05 LAB — OB RESULTS CONSOLE ABO/RH: RH Type: POSITIVE

## 2014-03-05 LAB — OB RESULTS CONSOLE HIV ANTIBODY (ROUTINE TESTING): HIV: NONREACTIVE

## 2014-03-05 LAB — OB RESULTS CONSOLE ANTIBODY SCREEN: ANTIBODY SCREEN: NEGATIVE

## 2014-03-05 LAB — OB RESULTS CONSOLE RPR: RPR: NONREACTIVE

## 2014-03-05 LAB — OB RESULTS CONSOLE GC/CHLAMYDIA
Chlamydia: NEGATIVE
Gonorrhea: NEGATIVE

## 2014-03-05 LAB — OB RESULTS CONSOLE RUBELLA ANTIBODY, IGM: Rubella: IMMUNE

## 2014-03-05 LAB — OB RESULTS CONSOLE HEPATITIS B SURFACE ANTIGEN: Hepatitis B Surface Ag: NEGATIVE

## 2014-07-21 ENCOUNTER — Encounter: Payer: Self-pay | Admitting: Nurse Practitioner

## 2014-08-11 ENCOUNTER — Inpatient Hospital Stay (HOSPITAL_COMMUNITY)
Admission: AD | Admit: 2014-08-11 | Discharge: 2014-08-11 | Disposition: A | Discharge status: Home / Self Care | Payer: BC Managed Care – PPO | Source: Ambulatory Visit | Attending: Obstetrics & Gynecology | Admitting: Obstetrics & Gynecology

## 2014-08-11 ENCOUNTER — Encounter (HOSPITAL_COMMUNITY): Payer: Self-pay

## 2014-08-11 DIAGNOSIS — O163 Unspecified maternal hypertension, third trimester: Secondary | ICD-10-CM | POA: Diagnosis not present

## 2014-08-11 DIAGNOSIS — R0989 Other specified symptoms and signs involving the circulatory and respiratory systems: Secondary | ICD-10-CM

## 2014-08-11 DIAGNOSIS — O26893 Other specified pregnancy related conditions, third trimester: Secondary | ICD-10-CM | POA: Diagnosis not present

## 2014-08-11 DIAGNOSIS — Z3A32 32 weeks gestation of pregnancy: Secondary | ICD-10-CM | POA: Insufficient documentation

## 2014-08-11 DIAGNOSIS — Z87891 Personal history of nicotine dependence: Secondary | ICD-10-CM | POA: Insufficient documentation

## 2014-08-11 DIAGNOSIS — R109 Unspecified abdominal pain: Secondary | ICD-10-CM | POA: Diagnosis not present

## 2014-08-11 LAB — PROTEIN / CREATININE RATIO, URINE
Creatinine, Urine: 104.14 mg/dL
PROTEIN CREATININE RATIO: 0.07 (ref 0.00–0.15)
TOTAL PROTEIN, URINE: 7.3 mg/dL

## 2014-08-11 LAB — URINALYSIS, ROUTINE W REFLEX MICROSCOPIC
Bilirubin Urine: NEGATIVE
Glucose, UA: NEGATIVE mg/dL
Hgb urine dipstick: NEGATIVE
KETONES UR: NEGATIVE mg/dL
NITRITE: NEGATIVE
PH: 7 (ref 5.0–8.0)
PROTEIN: NEGATIVE mg/dL
Specific Gravity, Urine: 1.02 (ref 1.005–1.030)
Urobilinogen, UA: 0.2 mg/dL (ref 0.0–1.0)

## 2014-08-11 LAB — CBC
HCT: 30 % — ABNORMAL LOW (ref 36.0–46.0)
Hemoglobin: 10.4 g/dL — ABNORMAL LOW (ref 12.0–15.0)
MCH: 29.5 pg (ref 26.0–34.0)
MCHC: 34.7 g/dL (ref 30.0–36.0)
MCV: 85.2 fL (ref 78.0–100.0)
Platelets: 313 10*3/uL (ref 150–400)
RBC: 3.52 MIL/uL — ABNORMAL LOW (ref 3.87–5.11)
RDW: 13.8 % (ref 11.5–15.5)
WBC: 9.8 10*3/uL (ref 4.0–10.5)

## 2014-08-11 LAB — URINE MICROSCOPIC-ADD ON

## 2014-08-11 LAB — COMPREHENSIVE METABOLIC PANEL
ALBUMIN: 2.7 g/dL — AB (ref 3.5–5.2)
ALK PHOS: 92 U/L (ref 39–117)
ALT: 8 U/L (ref 0–35)
AST: 9 U/L (ref 0–37)
Anion gap: 11 (ref 5–15)
BUN: 6 mg/dL (ref 6–23)
CO2: 23 mEq/L (ref 19–32)
Calcium: 8.9 mg/dL (ref 8.4–10.5)
Chloride: 101 mEq/L (ref 96–112)
Creatinine, Ser: 0.54 mg/dL (ref 0.50–1.10)
GFR calc Af Amer: >90 mL/min (ref >90)
GFR calc non Af Amer: >90 mL/min (ref >90)
Glucose, Bld: 77 mg/dL (ref 70–99)
Potassium: 3.5 mEq/L — ABNORMAL LOW (ref 3.7–5.3)
SODIUM: 135 meq/L — AB (ref 137–147)
TOTAL PROTEIN: 6.3 g/dL (ref 6.0–8.3)
Total Bilirubin: <0.2 mg/dL — ABNORMAL LOW (ref 0.3–1.2)

## 2014-08-11 NOTE — MAU Note (Signed)
Pt sent from the office for evaluation for elevated BP. Denies vaginal bleeding or discharge. Reports good fetal movement. Some right upper quadrant pain.

## 2014-08-11 NOTE — MAU Provider Note (Signed)
History     CSN: [id number]  Arrival date and time: 08/11/14 1228   None     Chief Complaint  Patient presents with  . PIH Evaluation    HPI This is a 28 y.o. at 2452w2d who presents from office for evaluation of an elevated BP in the office.  States has had upper abdominal pain earlier which was sharp and shooting in nature. States that is better now.  Denies headache or visual changes. Has had some swelling in hands.   RN Note:  Expand All Collapse All   Pt sent from the office for evaluation for elevated BP. Denies vaginal bleeding or discharge. Reports good fetal movement. Some right upper quadrant pain.        OB History    Gravida Para Term Preterm AB TAB SAB Ectopic Multiple Living   1 0              Past Medical History  Diagnosis Date  . Abnormal Pap smear 2006    "mild dysplasia", colpo negative  . PCOS (polycystic ovarian syndrome) 2011  . Ovarian cyst rupture 2012    Past Surgical History  Procedure Laterality Date  . Tonsillectomy  1995  . Ganglion cyst excision Right 2004    wrist  . Colposcopy  2006    negative    Family History  Problem Relation Age of Onset  . Hypertension Mother   . HIV Father   . Colon cancer Maternal Grandmother   . Heart disease Maternal Grandfather   . Diabetes Maternal Uncle   . Diabetes Maternal Uncle     History  Substance Use Topics  . Smoking status: Former Smoker -- 0.25 packs/day for 13 years    Types: Cigarettes    Quit date: 10/20/2012  . Smokeless tobacco: Never Used  . Alcohol Use: No    Allergies:  Allergies  Allergen Reactions  . Mango Flavor Anaphylaxis  . Penicillins Hives, Itching and Rash    Prescriptions prior to admission  Medication Sig Dispense Refill Last Dose  . escitalopram (LEXAPRO) 20 MG tablet Take 1 tablet (20 mg total) by mouth daily. 10 tablet 0 Not Taking  . metFORMIN (GLUCOPHAGE) 500 MG tablet Take 1 tablet (500 mg total) by mouth 2 (two) times daily with a meal. 90  tablet 1 Not Taking  . Prenatal Vit-Fe Fumarate-FA (GOODSENSE PRENATAL VITAMINS) 28-0.8 MG TABS Take 1 tablet by mouth daily.   Taking    Review of Systems  Constitutional: Negative for fever, chills and malaise/fatigue.  Eyes: Negative for blurred vision and double vision.  Cardiovascular: Negative for leg swelling.  Gastrointestinal: Positive for abdominal pain (improved now). Negative for nausea, vomiting, diarrhea and constipation.  Neurological: Negative for dizziness, focal weakness and headaches.   Physical Exam   Blood pressure 128/67, pulse 90, temperature 98 F (36.7 C), temperature source Oral, resp. rate 18, last menstrual period 12/28/2013.  Filed Vitals:   08/11/14 1335 08/11/14 1345 08/11/14 1400 08/11/14 1440  BP: 135/83 128/80 126/66 118/61  Pulse: 87 91 85 88  Temp:      TempSrc:      Resp:        Physical Exam  Constitutional: She is oriented to person, place, and time. She appears well-developed and well-nourished. No distress.  HENT:  Head: Normocephalic.  Cardiovascular: Normal rate.   Respiratory: Effort normal.  GI: Soft. Bowel sounds are normal. She exhibits no distension. There is no tenderness. There is no rebound and no  guarding.  Musculoskeletal: Normal range of motion.  Neurological: She is alert and oriented to person, place, and time.  Skin: Skin is warm and dry.  Psychiatric: She has a normal mood and affect.    MAU Course  Procedures  MDM PIH labs ordered  Results for orders placed or performed during the hospital encounter of 08/11/14 (from the past 24 hour(s))  Urinalysis, Routine w reflex microscopic     Status: Abnormal   Collection Time: 08/11/14 12:40 PM  Result Value Ref Range   Color, Urine YELLOW YELLOW   APPearance CLOUDY (A) CLEAR   Specific Gravity, Urine 1.020 1.005 - 1.030   pH 7.0 5.0 - 8.0   Glucose, UA NEGATIVE NEGATIVE mg/dL   Hgb urine dipstick NEGATIVE NEGATIVE   Bilirubin Urine NEGATIVE NEGATIVE   Ketones,  ur NEGATIVE NEGATIVE mg/dL   Protein, ur NEGATIVE NEGATIVE mg/dL   Urobilinogen, UA 0.2 0.0 - 1.0 mg/dL   Nitrite NEGATIVE NEGATIVE   Leukocytes, UA SMALL (A) NEGATIVE  Protein / creatinine ratio, urine     Status: None   Collection Time: 08/11/14 12:40 PM  Result Value Ref Range   Creatinine, Urine 104.14 mg/dL   Total Protein, Urine 7.3 mg/dL   Protein Creatinine Ratio 0.07 0.00 - 0.15  Urine microscopic-add on     Status: Abnormal   Collection Time: 08/11/14 12:40 PM  Result Value Ref Range   Squamous Epithelial / LPF FEW (A) RARE   WBC, UA 3-6 <3 WBC/hpf   Urine-Other AMORPHOUS URATES/PHOSPHATES   CBC     Status: Abnormal   Collection Time: 08/11/14  1:04 PM  Result Value Ref Range   WBC 9.8 4.0 - 10.5 K/uL   RBC 3.52 (L) 3.87 - 5.11 MIL/uL   Hemoglobin 10.4 (L) 12.0 - 15.0 g/dL   HCT 13.030.0 (L) 86.536.0 - 78.446.0 %   MCV 85.2 78.0 - 100.0 fL   MCH 29.5 26.0 - 34.0 pg   MCHC 34.7 30.0 - 36.0 g/dL   RDW 69.613.8 29.511.5 - 28.415.5 %   Platelets 313 150 - 400 K/uL  Comprehensive metabolic panel     Status: Abnormal   Collection Time: 08/11/14  1:04 PM  Result Value Ref Range   Sodium 135 (L) 137 - 147 mEq/L   Potassium 3.5 (L) 3.7 - 5.3 mEq/L   Chloride 101 96 - 112 mEq/L   CO2 23 19 - 32 mEq/L   Glucose, Bld 77 70 - 99 mg/dL   BUN 6 6 - 23 mg/dL   Creatinine, Ser 1.320.54 0.50 - 1.10 mg/dL   Calcium 8.9 8.4 - 44.010.5 mg/dL   Total Protein 6.3 6.0 - 8.3 g/dL   Albumin 2.7 (L) 3.5 - 5.2 g/dL   AST 9 0 - 37 U/L   ALT 8 0 - 35 U/L   Alkaline Phosphatase 92 39 - 117 U/L   Total Bilirubin <0.2 (L) 0.3 - 1.2 mg/dL   GFR calc non Af Amer >90 >90 mL/min   GFR calc Af Amer >90 >90 mL/min   Anion gap 11 5 - 15     Assessment and Plan  A:  SIUP at 361w2d       Labile hypertension, normotensive now.       Normal labs  P:  Discussed with Dr Langston MaskerMorris       Discharge home       [facility]       Follow up in office  [facility],Kristena Wilhelmi 08/11/2014, 1:02 PM

## 2014-08-11 NOTE — Discharge Instructions (Signed)

## 2014-08-18 ENCOUNTER — Encounter (HOSPITAL_COMMUNITY): Payer: Self-pay | Admitting: *Deleted

## 2014-08-18 ENCOUNTER — Observation Stay (HOSPITAL_COMMUNITY)
Admission: AD | Admit: 2014-08-18 | Discharge: 2014-08-19 | Disposition: A | Discharge status: Home / Self Care | Payer: BC Managed Care – PPO | Source: Ambulatory Visit | Attending: Obstetrics and Gynecology | Admitting: Obstetrics and Gynecology

## 2014-08-18 DIAGNOSIS — Z1389 Encounter for screening for other disorder: Secondary | ICD-10-CM | POA: Insufficient documentation

## 2014-08-18 DIAGNOSIS — O99513 Diseases of the respiratory system complicating pregnancy, third trimester: Secondary | ICD-10-CM | POA: Diagnosis not present

## 2014-08-18 DIAGNOSIS — Z87891 Personal history of nicotine dependence: Secondary | ICD-10-CM | POA: Insufficient documentation

## 2014-08-18 DIAGNOSIS — Z8639 Personal history of other endocrine, nutritional and metabolic disease: Secondary | ICD-10-CM | POA: Insufficient documentation

## 2014-08-18 DIAGNOSIS — J45909 Unspecified asthma, uncomplicated: Secondary | ICD-10-CM | POA: Diagnosis not present

## 2014-08-18 DIAGNOSIS — Z8742 Personal history of other diseases of the female genital tract: Secondary | ICD-10-CM | POA: Insufficient documentation

## 2014-08-18 DIAGNOSIS — O139 Gestational [pregnancy-induced] hypertension without significant proteinuria, unspecified trimester: Secondary | ICD-10-CM

## 2014-08-18 DIAGNOSIS — Z3A33 33 weeks gestation of pregnancy: Secondary | ICD-10-CM | POA: Insufficient documentation

## 2014-08-18 DIAGNOSIS — O133 Gestational [pregnancy-induced] hypertension without significant proteinuria, third trimester: Secondary | ICD-10-CM

## 2014-08-18 HISTORY — DX: Exercise induced bronchospasm: J45.990

## 2014-08-18 LAB — COMPREHENSIVE METABOLIC PANEL
ALT: 10 U/L (ref 0–35)
ANION GAP: 11 (ref 5–15)
AST: 10 U/L (ref 0–37)
Albumin: 2.8 g/dL — ABNORMAL LOW (ref 3.5–5.2)
Alkaline Phosphatase: 98 U/L (ref 39–117)
BUN: 6 mg/dL (ref 6–23)
CHLORIDE: 103 meq/L (ref 96–112)
CO2: 25 meq/L (ref 19–32)
Calcium: 9.1 mg/dL (ref 8.4–10.5)
Creatinine, Ser: 0.56 mg/dL (ref 0.50–1.10)
GFR calc non Af Amer: >90 mL/min (ref >90)
GLUCOSE: 72 mg/dL (ref 70–99)
POTASSIUM: 3.8 meq/L (ref 3.7–5.3)
SODIUM: 139 meq/L (ref 137–147)
Total Protein: 5.9 g/dL — ABNORMAL LOW (ref 6.0–8.3)

## 2014-08-18 LAB — URIC ACID: Uric Acid, Serum: 4 mg/dL (ref 2.4–7.0)

## 2014-08-18 LAB — TYPE AND SCREEN
ABO/RH(D): A POS
Antibody Screen: NEGATIVE

## 2014-08-18 LAB — CBC
HEMATOCRIT: 30.2 % — AB (ref 36.0–46.0)
Hemoglobin: 10.2 g/dL — ABNORMAL LOW (ref 12.0–15.0)
MCH: 28.9 pg (ref 26.0–34.0)
MCHC: 33.8 g/dL (ref 30.0–36.0)
MCV: 85.6 fL (ref 78.0–100.0)
Platelets: 303 10*3/uL (ref 150–400)
RBC: 3.53 MIL/uL — AB (ref 3.87–5.11)
RDW: 13.9 % (ref 11.5–15.5)
WBC: 10.4 10*3/uL (ref 4.0–10.5)

## 2014-08-18 LAB — LACTATE DEHYDROGENASE: LDH: 129 U/L (ref 94–250)

## 2014-08-18 MED ORDER — DOCUSATE SODIUM 100 MG PO CAPS: 100.0000 mg | ORAL_CAPSULE | Freq: Every day | ORAL | Status: DC

## 2014-08-18 MED ORDER — BETAMETHASONE SOD PHOS & ACET 6 (3-3) MG/ML IJ SUSP
12.0000 mg | INTRAMUSCULAR | Status: AC
  Administered 2014-08-18 – 2014-08-19 (×2): 12 mg via INTRAMUSCULAR
  Filled 2014-08-18 (×2): qty 2

## 2014-08-18 MED ORDER — PRENATAL MULTIVITAMIN CH
1.0000 | ORAL_TABLET | Freq: Every day | ORAL | Status: DC
  Administered 2014-08-18: 1 via ORAL
  Filled 2014-08-18: qty 1

## 2014-08-18 MED ORDER — CALCIUM CARBONATE ANTACID 500 MG PO CHEW: 2.0000 | CHEWABLE_TABLET | ORAL | Status: DC | PRN

## 2014-08-18 MED ORDER — ACETAMINOPHEN 325 MG PO TABS: 650.0000 mg | ORAL_TABLET | ORAL | Status: DC | PRN

## 2014-08-18 MED ORDER — ZOLPIDEM TARTRATE 5 MG PO TABS: 5.0000 mg | ORAL_TABLET | Freq: Every evening | ORAL | Status: DC | PRN

## 2014-08-18 NOTE — H&P (Signed)
Katie Dorsey is a 28 y.o. female G1 @ 33+2 wks presenting for elevated BP.  Pt was seen in office last week and BP 160/90.  Eval in MAU shows normal labs and d/c home.  Over weekend, BP at home 130-150/80-90s and today in office 152/90.  Slight HA.  No n/v, visual changes or RUQ pain.  Good FM.  History OB History    Gravida Para Term Preterm AB TAB SAB Ectopic Multiple Living   1 0             Past Medical History  Diagnosis Date  . Abnormal Pap smear 2006    "mild dysplasia", colpo negative  . PCOS (polycystic ovarian syndrome) 2011  . Ovarian cyst rupture 2012  . Asthma, exercise induced    Past Surgical History  Procedure Laterality Date  . Tonsillectomy  1995  . Ganglion cyst excision Right 2004    wrist  . Colposcopy  2006    negative   Family History: family history includes Cancer in her maternal grandfather; Colon cancer in her maternal grandmother; Diabetes in her maternal uncle and maternal uncle; HIV in her father; Heart disease in her maternal grandfather; Hyperlipidemia in her maternal grandfather; Hypertension in her mother. Social History:  reports that she quit smoking about 21 months ago. Her smoking use included Cigarettes. She has a 3.25 pack-year smoking history. She has never used smokeless tobacco. She reports that she does not drink alcohol or use illicit drugs.   Prenatal Transfer Tool  Maternal Diabetes: No Genetic Screening: Normal Maternal Ultrasounds/Referrals: Normal Fetal Ultrasounds or other Referrals:  None Maternal Substance Abuse:  No Significant Maternal Medications:  None Significant Maternal Lab Results:  None Other Comments:  None  ROS    Blood pressure 135/82, pulse 96, temperature 98.3 F (36.8 C), temperature source Oral, resp. rate 18, last menstrual period 12/28/2013. Exam Physical Exam  BP 152/90 Gen - NAD ABd - gravid, NT Ext = 2+ edema bilaterally, no clonus Cvx - deferred Prenatal labs: ABO, Rh:   Antibody:    Rubella:   RPR:    HBsAg:    HIV:    GBS:     Assessment/Plan:  Elevated BP - mild range, r/o pre-eclampsia Admit Labs Ultrasound BMZ as at risk for early delivery D/w patient OOW after admission if stable for d/c after BMZ   Stefanie Hodgens 08/18/2014, 3:57 PM

## 2014-08-19 ENCOUNTER — Inpatient Hospital Stay (HOSPITAL_COMMUNITY): Payer: BC Managed Care – PPO

## 2014-08-19 DIAGNOSIS — O133 Gestational [pregnancy-induced] hypertension without significant proteinuria, third trimester: Secondary | ICD-10-CM | POA: Diagnosis not present

## 2014-08-19 LAB — PROTEIN, URINE, 24 HOUR
Collection Interval-UPROT: 24 hours
PROTEIN, URINE: 11 mg/dL (ref 5–24)
Protein, 24H Urine: 325 mg/d — ABNORMAL HIGH (ref <150)
URINE TOTAL VOLUME-UPROT: 2950 mL

## 2014-08-19 LAB — CREATININE CLEARANCE, URINE, 24 HOUR
CREAT CLEAR: 244 mL/min — AB (ref 75–115)
CREATININE, URINE: 66.74 mg/dL
CREATININE: 0.56 mg/dL (ref 0.50–1.10)
Collection Interval-CRCL: 24 hours
Creatinine, 24H Ur: 1969 mg/d — ABNORMAL HIGH (ref 700–1800)
Urine Total Volume-CRCL: 2950 mL

## 2014-08-19 LAB — ABO/RH: ABO/RH(D): A POS

## 2014-08-19 NOTE — Plan of Care (Signed)
Problem: Consults Goal: Chaplain Consult Outcome: Not Applicable Date Met:  03/50/09 Goal: Home Health Consult Outcome: Not Applicable Date Met:  38/18/29 Goal: Neonatologist Consult Outcome: Not Applicable Date Met:  93/71/69 Goal: Physical Therapy Consult Outcome: Not Applicable Date Met:  67/89/38 Goal: NICU Tour Outcome: Not Applicable Date Met:  07/05/50 Goal: Nutrition Consult-if indicated Outcome: Not Applicable Date Met:  02/58/52 Goal: Designer, fashion/clothing (Diversional Activities, Age Appropriate) Outcome: Not Applicable Date Met:  77/82/42 Goal: Orientation to unit: [place] (smoking, visitation, chaplain services, helpline)  Outcome: Completed/Met Date Met:  08/19/14

## 2014-08-19 NOTE — Progress Notes (Signed)
Patient ID: Katie Dorsey, female   DOB: 01-10-1986, 28 y.o.   MRN: [id number] S: NO HEADACHES, SCOTOMATA OR RUQ PAIN O: BP'S  123/78-155/83  NOW 125/74      GRAVID UTERUS NONTENDER      DTR'S 2      FHR TRACING LAST PM CAT ONE      SONO AT MFM PENDING      PIH LABS OK A:IUP AT 33.3 WITH GESTATIONAL HYPERTENSION P: D/C HOME AFTER SECOND BMZ     F/U IN OFFICE TOMORROW     PIH WARNING SIGNS GIVEN

## 2014-08-19 NOTE — Discharge Summary (Signed)
  Admitting diagnosis: Intrauterine pregnancy at 33 weeks and 2 days with gestational hypertension  Discharge diagnosis: The same  Her complete history and physical please see dictated note  Or start hospital: The patient was brought in and placed on bedrest. Her blood pressure did much much better. Eyes blood pressure really hasn't was 155/83. Most of were in the 120s over 70. Her PIH labs were normal. It'll heart rate tracing was category 1. Did receive betamethasone. She had an ultrasound this morning with maternal fetal medicine the results are pending. After second dose of betamethasone she will be discharged home.  In terms of complications: Number encountered during her stay in the hospital  Patient discharged home in stable condition.   Patient will continue relative rest at home. PIH warning signs are given. Patient will follow-up in the office tomorrow.

## 2014-08-20 DIAGNOSIS — Z1389 Encounter for screening for other disorder: Secondary | ICD-10-CM | POA: Insufficient documentation

## 2014-08-20 DIAGNOSIS — Z3A33 33 weeks gestation of pregnancy: Secondary | ICD-10-CM | POA: Insufficient documentation

## 2014-09-03 ENCOUNTER — Encounter: Payer: Self-pay | Admitting: Nurse Practitioner

## 2014-09-16 ENCOUNTER — Encounter (HOSPITAL_COMMUNITY): Payer: Self-pay | Admitting: *Deleted

## 2014-09-16 ENCOUNTER — Telehealth (HOSPITAL_COMMUNITY): Payer: Self-pay | Admitting: *Deleted

## 2014-09-16 LAB — OB RESULTS CONSOLE GBS: STREP GROUP B AG: NEGATIVE

## 2014-09-16 NOTE — Telephone Encounter (Signed)
Preadmission screen  

## 2014-09-17 ENCOUNTER — Inpatient Hospital Stay (HOSPITAL_COMMUNITY)
Admission: RE | Admit: 2014-09-17 | Discharge: 2014-09-22 | DRG: 766 | Disposition: A | Discharge status: Home / Self Care | Payer: BLUE CROSS/BLUE SHIELD | Source: Ambulatory Visit | Attending: Obstetrics & Gynecology | Admitting: Obstetrics & Gynecology

## 2014-09-17 DIAGNOSIS — O139 Gestational [pregnancy-induced] hypertension without significant proteinuria, unspecified trimester: Secondary | ICD-10-CM | POA: Diagnosis present

## 2014-09-17 DIAGNOSIS — Z3403 Encounter for supervision of normal first pregnancy, third trimester: Secondary | ICD-10-CM | POA: Diagnosis present

## 2014-09-17 DIAGNOSIS — O133 Gestational [pregnancy-induced] hypertension without significant proteinuria, third trimester: Secondary | ICD-10-CM | POA: Diagnosis present

## 2014-09-17 DIAGNOSIS — Z3A37 37 weeks gestation of pregnancy: Secondary | ICD-10-CM | POA: Diagnosis present

## 2014-09-17 NOTE — Plan of Care (Signed)
Problem: Consults Goal: Birthing Suites Patient Information Press F2 to bring up selections list Outcome: Completed/Met Date Met:  09/17/14  Pt 37-[redacted] weeks EGA, Inpatient induction and PIH (Pregnancy induced hypertension)

## 2014-09-18 ENCOUNTER — Encounter (HOSPITAL_COMMUNITY): Payer: Self-pay

## 2014-09-18 DIAGNOSIS — O139 Gestational [pregnancy-induced] hypertension without significant proteinuria, unspecified trimester: Secondary | ICD-10-CM | POA: Diagnosis present

## 2014-09-18 LAB — CBC
HEMATOCRIT: 30.4 % — AB (ref 36.0–46.0)
HEMATOCRIT: 31.4 % — AB (ref 36.0–46.0)
Hemoglobin: 10.8 g/dL — ABNORMAL LOW (ref 12.0–15.0)
Hemoglobin: 10.2 g/dL — ABNORMAL LOW (ref 12.0–15.0)
MCH: 28.3 pg (ref 26.0–34.0)
MCH: 29.1 pg (ref 26.0–34.0)
MCHC: 34.4 g/dL (ref 30.0–36.0)
MCHC: 33.6 g/dL (ref 30.0–36.0)
MCV: 84.6 fL (ref 78.0–100.0)
MCV: 84.2 fL (ref 78.0–100.0)
Platelets: 271 10*3/uL (ref 150–400)
Platelets: 223 10*3/uL (ref 150–400)
RBC: 3.71 MIL/uL — ABNORMAL LOW (ref 3.87–5.11)
RBC: 3.61 MIL/uL — ABNORMAL LOW (ref 3.87–5.11)
RDW: 14.3 % (ref 11.5–15.5)
RDW: 14.3 % (ref 11.5–15.5)
WBC: 7.6 10*3/uL (ref 4.0–10.5)
WBC: 8.5 10*3/uL (ref 4.0–10.5)

## 2014-09-18 LAB — COMPREHENSIVE METABOLIC PANEL
ALBUMIN: 2.7 g/dL — AB (ref 3.5–5.2)
ALBUMIN: 2.8 g/dL — AB (ref 3.5–5.2)
ALT: 18 U/L (ref 0–35)
ALT: 19 U/L (ref 0–35)
AST: 24 U/L (ref 0–37)
AST: 32 U/L (ref 0–37)
Alkaline Phosphatase: 108 U/L (ref 39–117)
Alkaline Phosphatase: 107 U/L (ref 39–117)
Anion gap: 9 (ref 5–15)
Anion gap: 9 (ref 5–15)
BILIRUBIN TOTAL: 0.3 mg/dL (ref 0.3–1.2)
BUN: 7 mg/dL (ref 6–23)
BUN: 5 mg/dL — ABNORMAL LOW (ref 6–23)
CALCIUM: 9.1 mg/dL (ref 8.4–10.5)
CHLORIDE: 106 meq/L (ref 96–112)
CO2: 21 mmol/L (ref 19–32)
CO2: 23 mmol/L (ref 19–32)
CREATININE: 0.41 mg/dL — AB (ref 0.50–1.10)
Calcium: 8.5 mg/dL (ref 8.4–10.5)
Chloride: 105 mEq/L (ref 96–112)
Creatinine, Ser: 0.48 mg/dL — ABNORMAL LOW (ref 0.50–1.10)
GFR calc non Af Amer: >90 mL/min (ref >90)
GLUCOSE: 87 mg/dL (ref 70–99)
Glucose, Bld: 79 mg/dL (ref 70–99)
POTASSIUM: 3.2 mmol/L — AB (ref 3.5–5.1)
POTASSIUM: 3.6 mmol/L (ref 3.5–5.1)
Sodium: 136 mmol/L (ref 135–145)
Sodium: 137 mmol/L (ref 135–145)
Total Bilirubin: 0.3 mg/dL (ref 0.3–1.2)
Total Protein: 6.1 g/dL (ref 6.0–8.3)
Total Protein: 6.6 g/dL (ref 6.0–8.3)

## 2014-09-18 LAB — URIC ACID: URIC ACID, SERUM: 4.5 mg/dL (ref 2.4–7.0)

## 2014-09-18 LAB — TYPE AND SCREEN
ABO/RH(D): A POS
Antibody Screen: NEGATIVE

## 2014-09-18 LAB — RPR

## 2014-09-18 MED ORDER — OXYTOCIN 40 UNITS IN LACTATED RINGERS INFUSION - SIMPLE MED
1.0000 m[IU]/min | INTRAVENOUS | Status: DC
  Administered 2014-09-18: 2 m[IU]/min via INTRAVENOUS
  Filled 2014-09-18: qty 1000

## 2014-09-18 MED ORDER — LIDOCAINE HCL (PF) 1 % IJ SOLN: 30.0000 mL | INTRAMUSCULAR | Status: DC | PRN

## 2014-09-18 MED ORDER — ZOLPIDEM TARTRATE 5 MG PO TABS: 5.0000 mg | ORAL_TABLET | Freq: Every evening | ORAL | Status: DC | PRN

## 2014-09-18 MED ORDER — LACTATED RINGERS IV SOLN: 500.0000 mL | Freq: Once | INTRAVENOUS | Status: DC

## 2014-09-18 MED ORDER — PHENYLEPHRINE 40 MCG/ML (10ML) SYRINGE FOR IV PUSH (FOR BLOOD PRESSURE SUPPORT): 80.0000 ug | PREFILLED_SYRINGE | INTRAVENOUS | Status: DC | PRN

## 2014-09-18 MED ORDER — FLEET ENEMA 7-19 GM/118ML RE ENEM: 1.0000 | ENEMA | RECTAL | Status: DC | PRN

## 2014-09-18 MED ORDER — MISOPROSTOL 25 MCG QUARTER TABLET
25.0000 ug | ORAL_TABLET | ORAL | Status: DC | PRN
  Administered 2014-09-18 – 2014-09-19 (×5): 25 ug via VAGINAL
  Filled 2014-09-18 (×5): qty 0.25

## 2014-09-18 MED ORDER — OXYTOCIN BOLUS FROM INFUSION: 500.0000 mL | INTRAVENOUS | Status: DC

## 2014-09-18 MED ORDER — OXYCODONE-ACETAMINOPHEN 5-325 MG PO TABS: 2.0000 | ORAL_TABLET | ORAL | Status: DC | PRN

## 2014-09-18 MED ORDER — OXYTOCIN 40 UNITS IN LACTATED RINGERS INFUSION - SIMPLE MED: 62.5000 mL/h | INTRAVENOUS | Status: DC

## 2014-09-18 MED ORDER — TERBUTALINE SULFATE 1 MG/ML IJ SOLN: 0.2500 mg | Freq: Once | INTRAMUSCULAR | Status: AC | PRN

## 2014-09-18 MED ORDER — LACTATED RINGERS IV BOLUS (SEPSIS): 250.0000 mL | Freq: Once | INTRAVENOUS | Status: DC

## 2014-09-18 MED ORDER — OXYCODONE-ACETAMINOPHEN 5-325 MG PO TABS: 1.0000 | ORAL_TABLET | ORAL | Status: DC | PRN

## 2014-09-18 MED ORDER — FENTANYL 2.5 MCG/ML BUPIVACAINE 1/10 % EPIDURAL INFUSION (WH - ANES): 14.0000 mL/h | INTRAMUSCULAR | Status: DC | PRN

## 2014-09-18 MED ORDER — CLINDAMYCIN PHOSPHATE 900 MG/50ML IV SOLN: 900.0000 mg | Freq: Three times a day (TID) | INTRAVENOUS | Status: DC

## 2014-09-18 MED ORDER — DIPHENHYDRAMINE HCL 50 MG/ML IJ SOLN: 12.5000 mg | INTRAMUSCULAR | Status: DC | PRN

## 2014-09-18 MED ORDER — CITRIC ACID-SODIUM CITRATE 334-500 MG/5ML PO SOLN
30.0000 mL | ORAL | Status: DC | PRN
  Administered 2014-09-19: 30 mL via ORAL
  Filled 2014-09-18: qty 15

## 2014-09-18 MED ORDER — EPHEDRINE 5 MG/ML INJ: 10.0000 mg | INTRAVENOUS | Status: DC | PRN

## 2014-09-18 MED ORDER — LACTATED RINGERS IV SOLN
500.0000 mL | INTRAVENOUS | Status: DC | PRN
  Administered 2014-09-19: 500 mL via INTRAVENOUS

## 2014-09-18 MED ORDER — ACETAMINOPHEN 325 MG PO TABS: 650.0000 mg | ORAL_TABLET | ORAL | Status: DC | PRN

## 2014-09-18 MED ORDER — LACTATED RINGERS IV SOLN
INTRAVENOUS | Status: DC
  Administered 2014-09-18 – 2014-09-19 (×4): via INTRAVENOUS

## 2014-09-18 MED ORDER — LOPERAMIDE HCL 2 MG PO CAPS
2.0000 mg | ORAL_CAPSULE | Freq: Once | ORAL | Status: AC
  Administered 2014-09-18: 2 mg via ORAL
  Filled 2014-09-18: qty 1

## 2014-09-18 MED ORDER — ONDANSETRON HCL 4 MG/2ML IJ SOLN: 4.0000 mg | Freq: Four times a day (QID) | INTRAMUSCULAR | Status: DC | PRN

## 2014-09-18 NOTE — Progress Notes (Signed)
BPs 155/109. 160/101, 147/95, 127/75, 133/85 No HA or vision change Patient states BP up when having hard UCs  Cx 1-2/70/-2 FHT + reactive, 150-180s UCs q3-5 min Afeb  A/P: will check labs         Due to lack of cervical change, will stop pitocin and repeat Cytotec tonight         IV fluid bolus

## 2014-09-18 NOTE — H&P (Signed)
Katie Dorsey is a 28 y.o. female presenting for IOL for gestational hypertension. BP increasingly labile. No H/A or vision change. No epigastric pain. Maternal Medical History:  Fetal activity: Perceived fetal activity is normal.      OB History    Gravida Para Term Preterm AB TAB SAB Ectopic Multiple Living   1 0             Past Medical History  Diagnosis Date  . Abnormal Pap smear 2006    "mild dysplasia", colpo negative  . PCOS (polycystic ovarian syndrome) 2011  . Ovarian cyst rupture 2012  . Asthma, exercise induced    Past Surgical History  Procedure Laterality Date  . Tonsillectomy  1995  . Ganglion cyst excision Right 2004    wrist  . Colposcopy  2006    negative   Family History: family history includes Cancer in her maternal grandfather; Colon cancer in her maternal grandmother; Diabetes in her maternal uncle and maternal uncle; HIV in her father; Heart disease in her maternal grandfather; Hyperlipidemia in her maternal grandfather; Hypertension in her mother. Social History:  reports that she quit smoking about 22 months ago. Her smoking use included Cigarettes. She has a 3.25 pack-year smoking history. She has never used smokeless tobacco. She reports that she does not drink alcohol or use illicit drugs.   Prenatal Transfer Tool  Maternal Diabetes: No Genetic Screening: Normal Maternal Ultrasounds/Referrals: Normal Fetal Ultrasounds or other Referrals:  None Maternal Substance Abuse:  No Significant Maternal Medications:  None Significant Maternal Lab Results:  None Other Comments:  None  Review of Systems  Eyes: Negative for blurred vision.  Gastrointestinal: Negative for abdominal pain.  Neurological: Negative for headaches.    Dilation: 1 Effacement (%): Thick Station: -2 Exam by:: a, harper, rn Blood pressure 141/91, pulse 97, temperature 99 F (37.2 C), temperature source Oral, resp. rate 18, height 5\' 8"  (1.727 m), weight 270 lb (122.471 kg),  last menstrual period 12/28/2013. Maternal Exam:  Uterine Assessment: Contraction strength is mild.  Contraction frequency is rare.   Abdomen: Patient reports no abdominal tenderness. Fetal presentation: vertex     Fetal Exam Fetal State Assessment: Category I - tracings are normal.     Physical Exam  Cardiovascular: Normal rate and regular rhythm.   Respiratory: Effort normal and breath sounds normal.  GI: Soft. Bowel sounds are normal.  Neurological: She has normal reflexes.    Prenatal labs: ABO, Rh: --/--/A POS (12/30 2355) Antibody: NEG (12/30 2355) Rubella: Immune (06/17 0000) RPR: NON REAC (12/30 2355)  HBsAg: Negative (06/17 0000)  HIV: Non-reactive (06/17 0000)  GBS: Negative (12/29 0000)   Assessment/Plan: 28 yo G1P0 @ 37 5/7 weeks with gestational hypertension D/W patient and husband two stage induction-risks including fetal distress, emergency cesarean section, failed induction Will give Cytotec #3 and reasess 3-4 hrs later, D/W possible trial of pitocin-risks reviewed All questions answered Patient and husband state they understand and agree with above   Willian Donson II,Pristine Gladhill E 09/18/2014, 8:56 AM

## 2014-09-18 NOTE — Progress Notes (Signed)
Cx 1/90/-1 FHT reactive UCs q2-3 min Pitocin on

## 2014-09-19 ENCOUNTER — Inpatient Hospital Stay (HOSPITAL_COMMUNITY): Payer: BLUE CROSS/BLUE SHIELD | Admitting: Anesthesiology

## 2014-09-19 ENCOUNTER — Encounter (HOSPITAL_COMMUNITY): Payer: Self-pay

## 2014-09-19 ENCOUNTER — Encounter (HOSPITAL_COMMUNITY)
Admission: RE | Disposition: A | Discharge status: Home / Self Care | Payer: Self-pay | Source: Ambulatory Visit | Attending: Obstetrics & Gynecology

## 2014-09-19 SURGERY — Surgical Case
Anesthesia: Spinal | Site: Abdomen

## 2014-09-19 MED ORDER — KETOROLAC TROMETHAMINE 30 MG/ML IJ SOLN
INTRAMUSCULAR | Status: AC
  Administered 2014-09-19: 30 mg via INTRAMUSCULAR
  Filled 2014-09-19: qty 1

## 2014-09-19 MED ORDER — NALBUPHINE HCL 10 MG/ML IJ SOLN: 5.0000 mg | Freq: Once | INTRAMUSCULAR | Status: AC | PRN

## 2014-09-19 MED ORDER — NALBUPHINE HCL 10 MG/ML IJ SOLN: 5.0000 mg | INTRAMUSCULAR | Status: DC | PRN

## 2014-09-19 MED ORDER — SIMETHICONE 80 MG PO CHEW
80.0000 mg | CHEWABLE_TABLET | Freq: Three times a day (TID) | ORAL | Status: DC
  Administered 2014-09-19 – 2014-09-22 (×7): 80 mg via ORAL
  Filled 2014-09-19 (×6): qty 1

## 2014-09-19 MED ORDER — ONDANSETRON HCL 4 MG/2ML IJ SOLN
INTRAMUSCULAR | Status: DC | PRN
  Administered 2014-09-19: 4 mg via INTRAVENOUS

## 2014-09-19 MED ORDER — HYDROMORPHONE HCL 1 MG/ML IJ SOLN: 0.2500 mg | INTRAMUSCULAR | Status: DC | PRN

## 2014-09-19 MED ORDER — ONDANSETRON HCL 4 MG PO TABS: 4.0000 mg | ORAL_TABLET | ORAL | Status: DC | PRN

## 2014-09-19 MED ORDER — OXYTOCIN 10 UNIT/ML IJ SOLN
INTRAMUSCULAR | Status: AC
  Filled 2014-09-19: qty 4

## 2014-09-19 MED ORDER — OXYCODONE-ACETAMINOPHEN 5-325 MG PO TABS
2.0000 | ORAL_TABLET | ORAL | Status: DC | PRN
  Administered 2014-09-22: 2 via ORAL
  Filled 2014-09-19: qty 2

## 2014-09-19 MED ORDER — SIMETHICONE 80 MG PO CHEW: 80.0000 mg | CHEWABLE_TABLET | ORAL | Status: DC | PRN

## 2014-09-19 MED ORDER — SENNOSIDES-DOCUSATE SODIUM 8.6-50 MG PO TABS
2.0000 | ORAL_TABLET | ORAL | Status: DC
  Administered 2014-09-20 – 2014-09-21 (×3): 2 via ORAL
  Filled 2014-09-19 (×3): qty 2

## 2014-09-19 MED ORDER — BUPIVACAINE LIPOSOME 1.3 % IJ SUSP
INTRAMUSCULAR | Status: DC | PRN
  Administered 2014-09-19: 20 mL

## 2014-09-19 MED ORDER — FENTANYL CITRATE 0.05 MG/ML IJ SOLN
INTRAMUSCULAR | Status: AC
  Filled 2014-09-19: qty 2

## 2014-09-19 MED ORDER — TETANUS-DIPHTH-ACELL PERTUSSIS 5-2.5-18.5 LF-MCG/0.5 IM SUSP: 0.5000 mL | Freq: Once | INTRAMUSCULAR | Status: DC

## 2014-09-19 MED ORDER — FENTANYL CITRATE 0.05 MG/ML IJ SOLN
INTRAMUSCULAR | Status: DC | PRN
  Administered 2014-09-19: 12.5 ug via INTRATHECAL

## 2014-09-19 MED ORDER — IBUPROFEN 600 MG PO TABS
600.0000 mg | ORAL_TABLET | Freq: Four times a day (QID) | ORAL | Status: DC
  Administered 2014-09-19 – 2014-09-22 (×11): 600 mg via ORAL
  Filled 2014-09-19 (×11): qty 1

## 2014-09-19 MED ORDER — ONDANSETRON HCL 4 MG/2ML IJ SOLN: 4.0000 mg | Freq: Three times a day (TID) | INTRAMUSCULAR | Status: DC | PRN

## 2014-09-19 MED ORDER — KETOROLAC TROMETHAMINE 30 MG/ML IJ SOLN: 30.0000 mg | Freq: Four times a day (QID) | INTRAMUSCULAR | Status: DC | PRN

## 2014-09-19 MED ORDER — ONDANSETRON HCL 4 MG/2ML IJ SOLN
INTRAMUSCULAR | Status: AC
  Filled 2014-09-19: qty 2

## 2014-09-19 MED ORDER — PRENATAL MULTIVITAMIN CH
1.0000 | ORAL_TABLET | Freq: Every day | ORAL | Status: DC
  Administered 2014-09-19 – 2014-09-21 (×3): 1 via ORAL
  Filled 2014-09-19 (×3): qty 1

## 2014-09-19 MED ORDER — SIMETHICONE 80 MG PO CHEW
80.0000 mg | CHEWABLE_TABLET | ORAL | Status: DC
  Administered 2014-09-20 – 2014-09-21 (×3): 80 mg via ORAL
  Filled 2014-09-19 (×3): qty 1

## 2014-09-19 MED ORDER — DIPHENHYDRAMINE HCL 25 MG PO CAPS: 25.0000 mg | ORAL_CAPSULE | ORAL | Status: DC | PRN

## 2014-09-19 MED ORDER — MENTHOL 3 MG MT LOZG: 1.0000 | LOZENGE | OROMUCOSAL | Status: DC | PRN

## 2014-09-19 MED ORDER — SCOPOLAMINE 1 MG/3DAYS TD PT72
1.0000 | MEDICATED_PATCH | Freq: Once | TRANSDERMAL | Status: AC
  Administered 2014-09-19: 1.5 mg via TRANSDERMAL

## 2014-09-19 MED ORDER — NALOXONE HCL 0.4 MG/ML IJ SOLN: 0.4000 mg | INTRAMUSCULAR | Status: DC | PRN

## 2014-09-19 MED ORDER — ONDANSETRON HCL 4 MG/2ML IJ SOLN
4.0000 mg | INTRAMUSCULAR | Status: DC | PRN
Start: 2014-09-19 — End: 2014-09-22

## 2014-09-19 MED ORDER — MEPERIDINE HCL 25 MG/ML IJ SOLN: 6.2500 mg | INTRAMUSCULAR | Status: DC | PRN

## 2014-09-19 MED ORDER — SODIUM CHLORIDE 0.9 % IJ SOLN
INTRAMUSCULAR | Status: AC
  Filled 2014-09-19: qty 20

## 2014-09-19 MED ORDER — MORPHINE SULFATE 0.5 MG/ML IJ SOLN
INTRAMUSCULAR | Status: AC
Start: 2014-09-19 — End: 2014-09-19
  Filled 2014-09-19: qty 10

## 2014-09-19 MED ORDER — LACTATED RINGERS IV SOLN
INTRAVENOUS | Status: DC
  Administered 2014-09-19: 22:00:00 via INTRAVENOUS

## 2014-09-19 MED ORDER — OXYTOCIN 10 UNIT/ML IJ SOLN
40.0000 [IU] | INTRAVENOUS | Status: DC | PRN
  Administered 2014-09-19: 40 [IU] via INTRAVENOUS

## 2014-09-19 MED ORDER — ZOLPIDEM TARTRATE 5 MG PO TABS: 5.0000 mg | ORAL_TABLET | Freq: Every evening | ORAL | Status: DC | PRN

## 2014-09-19 MED ORDER — LANOLIN HYDROUS EX OINT: 1.0000 "application " | TOPICAL_OINTMENT | CUTANEOUS | Status: DC | PRN

## 2014-09-19 MED ORDER — PROMETHAZINE HCL 25 MG/ML IJ SOLN: 6.2500 mg | INTRAMUSCULAR | Status: DC | PRN

## 2014-09-19 MED ORDER — MORPHINE SULFATE (PF) 0.5 MG/ML IJ SOLN
INTRAMUSCULAR | Status: DC | PRN
  Administered 2014-09-19: .2 mg via INTRATHECAL

## 2014-09-19 MED ORDER — SODIUM CHLORIDE 0.9 % IJ SOLN: 3.0000 mL | INTRAMUSCULAR | Status: DC | PRN

## 2014-09-19 MED ORDER — WITCH HAZEL-GLYCERIN EX PADS: 1.0000 "application " | MEDICATED_PAD | CUTANEOUS | Status: DC | PRN

## 2014-09-19 MED ORDER — SODIUM CHLORIDE 0.9 % IJ SOLN
INTRAMUSCULAR | Status: DC | PRN
  Administered 2014-09-19: 10 mL via INTRAVENOUS

## 2014-09-19 MED ORDER — DIBUCAINE 1 % RE OINT: 1.0000 "application " | TOPICAL_OINTMENT | RECTAL | Status: DC | PRN

## 2014-09-19 MED ORDER — BUPIVACAINE IN DEXTROSE 0.75-8.25 % IT SOLN
INTRATHECAL | Status: DC | PRN
Start: 2014-09-19 — End: 2014-09-19
  Administered 2014-09-19: 1.6 mL via INTRATHECAL

## 2014-09-19 MED ORDER — PHENYLEPHRINE 8 MG IN D5W 100 ML (0.08MG/ML) PREMIX OPTIME
INJECTION | INTRAVENOUS | Status: DC | PRN
Start: 2014-09-19 — End: 2014-09-19
  Administered 2014-09-19: 60 ug/min via INTRAVENOUS

## 2014-09-19 MED ORDER — GENTAMICIN SULFATE 40 MG/ML IJ SOLN
INTRAVENOUS | Status: AC
  Administered 2014-09-19: 116 mL via INTRAVENOUS
  Filled 2014-09-19: qty 10

## 2014-09-19 MED ORDER — OXYTOCIN 40 UNITS IN LACTATED RINGERS INFUSION - SIMPLE MED: 62.5000 mL/h | INTRAVENOUS | Status: AC

## 2014-09-19 MED ORDER — KETOROLAC TROMETHAMINE 30 MG/ML IJ SOLN
30.0000 mg | Freq: Four times a day (QID) | INTRAMUSCULAR | Status: DC | PRN
  Administered 2014-09-19: 30 mg via INTRAMUSCULAR

## 2014-09-19 MED ORDER — NALOXONE HCL 1 MG/ML IJ SOLN
1.0000 ug/kg/h | INTRAVENOUS | Status: DC | PRN
  Filled 2014-09-19: qty 2

## 2014-09-19 MED ORDER — SODIUM CHLORIDE 0.9 % IR SOLN
Status: DC | PRN
  Administered 2014-09-19: 300 mL

## 2014-09-19 MED ORDER — OXYCODONE-ACETAMINOPHEN 5-325 MG PO TABS
1.0000 | ORAL_TABLET | ORAL | Status: DC | PRN
  Administered 2014-09-19 – 2014-09-22 (×8): 1 via ORAL
  Filled 2014-09-19 (×8): qty 1

## 2014-09-19 MED ORDER — BUPIVACAINE LIPOSOME 1.3 % IJ SUSP
20.0000 mL | Freq: Once | INTRAMUSCULAR | Status: DC
  Filled 2014-09-19: qty 20

## 2014-09-19 MED ORDER — IBUPROFEN 600 MG PO TABS: 600.0000 mg | ORAL_TABLET | Freq: Four times a day (QID) | ORAL | Status: DC | PRN

## 2014-09-19 MED ORDER — SCOPOLAMINE 1 MG/3DAYS TD PT72
MEDICATED_PATCH | TRANSDERMAL | Status: AC
  Filled 2014-09-19: qty 1

## 2014-09-19 MED ORDER — DIPHENHYDRAMINE HCL 50 MG/ML IJ SOLN: 12.5000 mg | INTRAMUSCULAR | Status: DC | PRN

## 2014-09-19 MED ORDER — PHENYLEPHRINE 8 MG IN D5W 100 ML (0.08MG/ML) PREMIX OPTIME
INJECTION | INTRAVENOUS | Status: AC
  Filled 2014-09-19: qty 100

## 2014-09-19 MED ORDER — DIPHENHYDRAMINE HCL 25 MG PO CAPS: 25.0000 mg | ORAL_CAPSULE | Freq: Four times a day (QID) | ORAL | Status: DC | PRN

## 2014-09-19 SURGICAL SUPPLY — 35 items
BENZOIN TINCTURE PRP APPL 2/3 (GAUZE/BANDAGES/DRESSINGS) ×3 IMPLANT
CLAMP CORD UMBIL (MISCELLANEOUS) IMPLANT
CLOSURE WOUND 1/2 X4 (GAUZE/BANDAGES/DRESSINGS) ×1
CLOTH BEACON ORANGE TIMEOUT ST (SAFETY) ×3 IMPLANT
CONTAINER PREFILL 10% NBF 15ML (MISCELLANEOUS) IMPLANT
DRAPE SHEET LG 3/4 BI-LAMINATE (DRAPES) IMPLANT
DRSG OPSITE POSTOP 4X10 (GAUZE/BANDAGES/DRESSINGS) ×3 IMPLANT
DURAPREP 26ML APPLICATOR (WOUND CARE) ×3 IMPLANT
ELECT REM PT RETURN 9FT ADLT (ELECTROSURGICAL) ×3
ELECTRODE REM PT RTRN 9FT ADLT (ELECTROSURGICAL) ×1 IMPLANT
EXTRACTOR VACUUM M CUP 4 TUBE (SUCTIONS) ×2 IMPLANT
EXTRACTOR VACUUM M CUP 4' TUBE (SUCTIONS) ×1
GLOVE BIO SURGEON STRL SZ7.5 (GLOVE) ×3 IMPLANT
GOWN STRL REUS W/TWL LRG LVL3 (GOWN DISPOSABLE) ×6 IMPLANT
KIT ABG SYR 3ML LUER SLIP (SYRINGE) ×3 IMPLANT
LIQUID BAND (GAUZE/BANDAGES/DRESSINGS) IMPLANT
NEEDLE HYPO 21X1.5 SAFETY (NEEDLE) ×3 IMPLANT
NEEDLE HYPO 25X5/8 SAFETYGLIDE (NEEDLE) ×3 IMPLANT
NS IRRIG 1000ML POUR BTL (IV SOLUTION) ×3 IMPLANT
PACK C SECTION WH (CUSTOM PROCEDURE TRAY) ×3 IMPLANT
PAD OB MATERNITY 4.3X12.25 (PERSONAL CARE ITEMS) ×3 IMPLANT
STAPLER VISISTAT 35W (STAPLE) IMPLANT
STRIP CLOSURE SKIN 1/2X4 (GAUZE/BANDAGES/DRESSINGS) ×2 IMPLANT
SUT MNCRL 0 VIOLET CTX 36 (SUTURE) ×4 IMPLANT
SUT MONOCRYL 0 CTX 36 (SUTURE) ×8
SUT PDS AB 0 CTX 60 (SUTURE) ×3 IMPLANT
SUT PLAIN 0 NONE (SUTURE) IMPLANT
SUT PLAIN 2 0 (SUTURE)
SUT PLAIN 2 0 XLH (SUTURE) IMPLANT
SUT PLAIN ABS 2-0 CT1 27XMFL (SUTURE) IMPLANT
SUT VIC AB 4-0 KS 27 (SUTURE) ×3 IMPLANT
SYR 20CC LL (SYRINGE) ×3 IMPLANT
TOWEL OR 17X24 6PK STRL BLUE (TOWEL DISPOSABLE) ×3 IMPLANT
TRAY FOLEY CATH 14FR (SET/KITS/TRAYS/PACK) ×3 IMPLANT
WATER STERILE IRR 1000ML POUR (IV SOLUTION) ×3 IMPLANT

## 2014-09-19 NOTE — Progress Notes (Signed)
Cx no change BPs stable Labs last pm OK FHT - cytotec placed about 6am, now repetitive decels treated with oxygen, position change, IV fluid bolus            Decels continue Uterus soft, NT No bleeding UCs difficult to trace  A/P: in view of anticipated length of labor and repetitive decels, rec: delivery         D/W patient above, D/W c/s and risks including infection , organ damage, DVT/PE, pneumonia, bleeding/transfusion-HIV/Hep.         All questions answered-patient states she understands and agrees

## 2014-09-19 NOTE — Anesthesia Postprocedure Evaluation (Signed)
Anesthesia Post Note  Patient: Katie Dorsey  Procedure(s) Performed: Procedure(s) (LRB): CESAREAN SECTION (N/A)  Anesthesia type: Spinal  Patient location: PACU  Post pain: Pain level controlled  Post assessment: Post-op Vital signs reviewed  Last Vitals:  Filed Vitals:   09/19/14 1027  BP:   Pulse:   Temp:   Resp: 20    Post vital signs: Reviewed  Level of consciousness: awake  Complications: No apparent anesthesia complications

## 2014-09-19 NOTE — Anesthesia Postprocedure Evaluation (Signed)
Anesthesia Post Note  Patient: Katie Dorsey  Procedure(s) Performed: Procedure(s) (LRB): CESAREAN SECTION (N/A)  Anesthesia type: SAB  Patient location: Mother/Baby  Post pain: Pain level controlled  Post assessment: Post-op Vital signs reviewed  Last Vitals:  Filed Vitals:   09/19/14 1415  BP: 128/83  Pulse: 80  Temp: 36.7 C  Resp: 20    Post vital signs: Reviewed  Level of consciousness: awake  Complications: No apparent anesthesia complications

## 2014-09-19 NOTE — Transfer of Care (Signed)
Immediate Anesthesia Transfer of Care Note  Patient: Katie Dorsey  Procedure(s) Performed: Procedure(s): CESAREAN SECTION (N/A)  Patient Location: PACU  Anesthesia Type:Spinal  Level of Consciousness: awake and alert   Airway & Oxygen Therapy: Patient Spontanous Breathing  Post-op Assessment: Report given to PACU RN and Post -op Vital signs reviewed and stable  Post vital signs: Reviewed and stable  Complications: No apparent anesthesia complications

## 2014-09-19 NOTE — Brief Op Note (Signed)
09/17/2014 - 09/19/2014  9:12 AM  PATIENT:  Katie Dorsey  29 y.o. female  PRE-OPERATIVE DIAGNOSIS:  Nonreassuring Fetal Heartrate  POST-OPERATIVE DIAGNOSIS:  Nonreassuring Fetal Heartrate  PROCEDURE:  Procedure(s): CESAREAN SECTION (N/A)  SURGEON:  Surgeon(s) and Role:    * Leslie Andrea, MD - Primary  PHYSICIAN ASSISTANT:   ASSISTANTS: none   ANESTHESIA:   spinal  EBL:  Total I/O In: 1900 [I.V.:1900] Out: 650 [Urine:150; Blood:500]  BLOOD ADMINISTERED:none  DRAINS: Urinary Catheter (Foley)   LOCAL MEDICATIONS USED:  OTHER Exparel 20 ml in Saline 20 ml  SPECIMEN:  Source of Specimen:  placenta  DISPOSITION OF SPECIMEN:  PATHOLOGY  COUNTS:  YES  TOURNIQUET:  * No tourniquets in log *  DICTATION: .Other Dictation: Dictation Number [id number]  PLAN OF CARE: Admit to inpatient   PATIENT DISPOSITION:  PACU - hemodynamically stable.   Delay start of Pharmacological VTE agent (>24hrs) due to surgical blood loss or risk of bleeding: not applicable

## 2014-09-19 NOTE — Anesthesia Preprocedure Evaluation (Signed)
Anesthesia Evaluation  Patient identified by MRN, date of birth, ID band Patient awake    Reviewed: Allergy & Precautions, H&P , NPO status , Patient's Chart, lab work & pertinent test results  Airway Mallampati: II  TM Distance: >3 FB Neck ROM: full    Dental no notable dental hx.    Pulmonary former smoker,    Pulmonary exam normal       Cardiovascular negative cardio ROS      Neuro/Psych negative neurological ROS  negative psych ROS   GI/Hepatic negative GI ROS, Neg liver ROS,   Endo/Other  Morbid obesity  Renal/GU negative Renal ROS     Musculoskeletal   Abdominal (+) + obese,   Peds  Hematology negative hematology ROS (+)   Anesthesia Other Findings   Reproductive/Obstetrics (+) Pregnancy                             Anesthesia Physical Anesthesia Plan  ASA: III and emergent  Anesthesia Plan: Spinal   Post-op Pain Management:    Induction:   Airway Management Planned:   Additional Equipment:   Intra-op Plan:   Post-operative Plan:   Informed Consent: I have reviewed the patients History and Physical, chart, labs and discussed the procedure including the risks, benefits and alternatives for the proposed anesthesia with the patient or authorized representative who has indicated his/her understanding and acceptance.     Plan Discussed with: CRNA and Surgeon  Anesthesia Plan Comments:         Anesthesia Quick Evaluation

## 2014-09-19 NOTE — Op Note (Signed)
NAMEYING, BLANKENHORN                ACCOUNT NO.:  [id number]  MEDICAL RECORD NO.:  [id number]  LOCATION:  [facility]                          FACILITY:  [facility]  PHYSICIAN:  Guy Sandifer. Henderson Cloud, M.D. DATE OF BIRTH:  Dec 03, 1985  DATE OF PROCEDURE:  09/19/2014 DATE OF DISCHARGE:                              OPERATIVE REPORT   PREOPERATIVE DIAGNOSIS:  Nonreassuring fetal heart tracing.  POSTOPERATIVE DIAGNOSIS:  Non-reassuring fetal heart tracing.  PROCEDURE:  Low-transverse cesarean section.  SURGEON:  Guy Sandifer. Henderson Cloud, MD  ANESTHESIA:  Spinal, Quillian Quince, MD  ESTIMATED BLOOD LOSS:  500 mL.  SPECIMENS:  Placenta to Pathology.  FINDINGS:  Viable female infant, Apgars 2, cord pH and weight are pending.  INDICATIONS AND CONSENT:  This patient is a 29 year old married white female, G1, P0, at 54 and 6/7 th weeks who is admitted for induction of labor due to gestational hypertension.  She undergoes two-stage induction over the evening and was placed on Pitocin yesterday.  She is having no change on her cervix last evening.  Pitocin was discontinued, and Cytotec was repeated through the night.  After the Cytotec was placed at about 6:00 a.m., the baby had repetitive decelerations. Details are in the progress notes.  Delivery was recommended.  Potential risks and complications are discussed including but not limited to infection, organ damage, bleeding requiring transfusion of blood products with HIV and hepatitis acquisition, DVT, PE, pneumonia, return to the operating room, pelvic pain, abdominal pain.  All questions were answered and consent was signed on the chart.  DESCRIPTION OF PROCEDURE:  The patient was taken to the operating room where she was identified.  Spinal anesthetic was placed per Dr. Arby Barrette, and she was placed in a dorsal supine position with a 15- degree left lateral wedge.  Time-out was undertaken.  She was prepped. Foley catheter was placed.  The bladder was drained.   She was draped per [facility] protocol.  After testing for adequate spinal anesthesia, skin was entered through a Pfannenstiel incision and dissection was carried out in layers to the peritoneum.  Peritoneum was incised, extended superiorly and inferiorly.  Vesicouterine peritoneum was taken down cephalolaterally.  The bladder flap was developed.  The bladder blade was placed.  Uterus was then incised in a low transverse manner.  The uterine cavity was entered bluntly with a hemostat. Copious amount of light brown fluid was noted.  The incision was extended cephalolaterally with the fingers.  Vertex was delivered up into the incision, and a mushroom vacuum extractor and a very gentle traction with 1 brief lift and no pop-offs was used to elevate the vertex through the incision.  The remainder of the baby was then delivered.  Cord was clamped and cut.  The baby was handed to awaiting pediatrics team.  Placenta was manually delivered and sent to Pathology. Uterine cavity was clean.  Uterus was closed in a running locking manner with 2 imbricating layers of 0 Monocryl suture which achieved good hemostasis.  Anterior peritoneum was closed in a running fashion with 0 Monocryl suture which was also used to reapproximate the pyramidalis muscle in the midline.  Anterior rectus fascia was closed in  a running fashion with a 0 looped PDS.  A 20 mL of Exparel and 20 mL of saline was then injected both subfascially and subcutaneously.  Subcutaneous layer was closed with interrupted 2-0 plain suture, and the skin was closed with 0 Vicryl on a Keith needle.  Steri-Strips were applied.  Pressure dressing was applied.  All counts were correct.  The patient was taken to the recovery room in stable condition.     Guy Sandifer Henderson Cloud, M.D.     JET/MEDQ  D:  09/19/2014  T:  09/19/2014  Job:  [id number]

## 2014-09-19 NOTE — Lactation Note (Signed)
This note was copied from the chart of Katie Alaze Garverick. Lactation Consultation Note  Patient Name: Katie Dorsey ZOXWR'U Date: 09/19/2014 Reason for consult: Initial assessment of this mom and baby at 11 hours pp.  Mom is a primipara and states her newborn has latched several times since delivery but was sleepy at most recent attempt.  LATCH score=8 per RN assessment and mom says it is easier to handle baby on (L) right now due to IV and monitor attachments.  LC encouraged frequent STS and cue feedings.  At time of visit, baby asleep and STS with FOB.  No feeding cues at this time.  LC discussed normal newborn sleepiness during first 24 hours. Mom encouraged to feed baby 8-12 times/24 hours and with feeding cues. LC encouraged review of Baby and Me pp 9, 14 and 20-25 for STS and BF information. LC provided [employer] brochure and reviewed [facility] services and list of community and web site resources.     Maternal Data Formula Feeding for Exclusion: No Has patient been taught Hand Expression?:  (not yet documented) Does the patient have breastfeeding experience prior to this delivery?: No  Feeding Feeding Type: Breast Fed Length of feed: 10 min  LATCH Score/Interventions            Most recent LATCH score=8 per RN assessment          Lactation Tools Discussed/Used   STS, cue feedings, normal newborn sleepy behavior for first 24 hours  Consult Status Consult Status: Follow-up Date: 09/20/14 Follow-up type: In-patient    Warrick Parisian [facility] 09/19/2014, 8:04 PM

## 2014-09-19 NOTE — Addendum Note (Signed)
Addendum  created 09/19/14 1533 by Jhonnie Garner, CRNA   Modules edited: Notes Section   Notes Section:  File: [id number]

## 2014-09-19 NOTE — Anesthesia Procedure Notes (Signed)
Spinal Patient location during procedure: OR Start time: 09/19/2014 8:15 AM End time: 09/19/2014 8:18 AM Staffing Anesthesiologist: Leilani Able Preanesthetic Checklist Completed: patient identified, surgical consent, pre-op evaluation, timeout performed, IV checked, risks and benefits discussed and monitors and equipment checked Spinal Block Patient position: sitting Prep: DuraPrep Patient monitoring: heart rate, cardiac monitor, continuous pulse ox and blood pressure Approach: midline Location: L3-4 Injection technique: single-shot Needle Needle type: Pencan  Needle gauge: 24 G Needle length: 9 cm Needle insertion depth: 9 cm Assessment Sensory level: T4

## 2014-09-20 LAB — CBC
HCT: 29.2 % — ABNORMAL LOW (ref 36.0–46.0)
Hemoglobin: 9.9 g/dL — ABNORMAL LOW (ref 12.0–15.0)
MCH: 29 pg (ref 26.0–34.0)
MCHC: 33.9 g/dL (ref 30.0–36.0)
MCV: 85.6 fL (ref 78.0–100.0)
Platelets: 237 10*3/uL (ref 150–400)
RBC: 3.41 MIL/uL — ABNORMAL LOW (ref 3.87–5.11)
RDW: 14.3 % (ref 11.5–15.5)
WBC: 8.5 10*3/uL (ref 4.0–10.5)

## 2014-09-20 NOTE — Lactation Note (Signed)
This note was copied from the chart of Boy Teesha Ohm. Lactation Consultation Note  P1, Mother has short shaft nipples that evert w/stimulation. Suggest mother prepump before latching w/hand pump and provided mother w/shells to wear when not breastfeeding. Mother states baby breastfeeds better on left than right and falls asleep easily. Encouraged mother to feed STS and massage her breast while feeding. Left LC phone number and suggest she call when baby wake to breastfeed.  Patient Name: [name] ZOXWR'U Date: 09/20/2014 Reason for consult: Follow-up assessment   Maternal Data    Feeding Length of feed:  (few sucks, too sleepy)  LATCH Score/Interventions                      Lactation Tools Discussed/Used     Consult Status Consult Status: Follow-up Date: 09/20/14 Follow-up type: In-patient    Dahlia Byes [facility] 09/20/2014, 2:02 PM

## 2014-09-20 NOTE — Lactation Note (Signed)
This note was copied from the chart of Katie Syd Champa. Lactation CoAniston Christmann Note  Patient Name: Katie Dorsey ZOXWR'U Date: 09/20/2014 Reason for consult: Follow-up assessment;Difficult latch. Mom having latch difficulty on (R) and is wearing shells to help nipples stretch between feedings.  Nipple is everted and colostrum is expressible.  Baby was circumcised this afternoon but is cuing and is able to latch but at first, mom c/o nipple pinching.  Baby re-latched with brief chin tug and mom reports minimal nipple discomfort (down from "7" to "3") and he sucks rhythmically with swallows for 7 minutes, slips off and re-latches more quickly but still needed brief chin tug.  MGM and FOB shown how to assist.  LC encouraged mom to support breast throughout feeding and perform alternate breast compression if baby sleepy.  LC observed >10 minutes of total feeding and swallows.  Mom to continue cue feedings and to call for help as needed.   Maternal Data    Feeding Feeding Type: Breast Fed Length of feed:  (LC observed 7, then 3-4 additional minutes)  LATCH Score/Interventions Latch: Grasps breast easily, tongue down, lips flanged, rhythmical sucking. (chin tug needed to widen latch) Intervention(s): Adjust position;Assist with latch;Breast compression  Audible Swallowing: Spontaneous and intermittent Intervention(s): Hand expression;Skin to skin;Alternate breast massage  Type of Nipple: Everted at rest and after stimulation Intervention(s): Shells  Comfort (Breast/Nipple): Filling, red/small blisters or bruises, mild/mod discomfort (no trauma and pain eased with chin tug for deeper latch)  Problem noted: Mild/Moderate discomfort Interventions (Mild/moderate discomfort): Hand expression  Hold (Positioning): Assistance needed to correctly position infant at breast and maintain latch. Intervention(s): Breastfeeding basics reviewed;Support Pillows;Position options;Skin to skin (encouraged  football for (R) and sliding from (L) if baby refusing (R))  LATCH Score: 8 (LC assisted and observed)  Lactation Tools Discussed/Used   Chin tug and/or breast compression to ensure deep latch Signs of proper latch and milk transfer Shells between feedings to help nipples evert  Consult Status Consult Status: Follow-up Date: 09/21/14 Follow-up type: In-patient    Warrick Parisian [facility] 09/20/2014, 6:06 PM

## 2014-09-20 NOTE — Discharge Instructions (Signed)
Iron-Rich Diet An iron-rich diet contains foods that are good sources of iron. Iron is an important mineral that helps your body produce hemoglobin. Hemoglobin is a protein in red blood cells that carries oxygen to the body's tissues. Sometimes, the iron level in your blood can be low. This may be caused by:  A lack of iron in your diet.  Blood loss.  Times of growth, such as during pregnancy or during a child's growth and development. Low levels of iron can cause a decrease in the number of red blood cells. This can result in iron deficiency anemia. Iron deficiency anemia symptoms include:  Tiredness.  Weakness.  Irritability.  Increased chance of infection. Here are some recommendations for daily iron intake:  Males older than 31 years of age need 8 mg of iron per day.  Women ages 19 to 50 need 18 mg of iron per day.  Pregnant women need 27 mg of iron per day, and women who are over 19 years of age and breastfeeding need 9 mg of iron per day.  Women over the age of 50 need 8 mg of iron per day. SOURCES OF IRON There are 2 types of iron that are found in food: heme iron and nonheme iron. Heme iron is absorbed by the body better than nonheme iron. Heme iron is found in meat, poultry, and fish. Nonheme iron is found in grains, beans, and vegetables. Heme Iron Sources Food / Iron (mg)  Chicken liver, 3 oz (85 g)/ 10 mg  Beef liver, 3 oz (85 g)/ 5.5 mg  Oysters, 3 oz (85 g)/ 8 mg  Beef, 3 oz (85 g)/ 2 to 3 mg  Shrimp, 3 oz (85 g)/ 2.8 mg  Turkey, 3 oz (85 g)/ 2 mg  Chicken, 3 oz (85 g) / 1 mg  Fish (tuna, halibut), 3 oz (85 g)/ 1 mg  Pork, 3 oz (85 g)/ 0.9 mg Nonheme Iron Sources Food / Iron (mg)  Ready-to-eat breakfast cereal, iron-fortified / 3.9 to 7 mg  Tofu,  cup / 3.4 mg  Kidney beans,  cup / 2.6 mg  Baked potato with skin / 2.7 mg  Asparagus,  cup / 2.2 mg  Avocado / 2 mg  Dried peaches,  cup / 1.6 mg  Raisins,  cup / 1.5 mg  Soy milk, 1 cup  / 1.5 mg  Whole-wheat bread, 1 slice / 1.2 mg  Spinach, 1 cup / 0.8 mg  Broccoli,  cup / 0.6 mg IRON ABSORPTION Certain foods can decrease the body's absorption of iron. Try to avoid these foods and beverages while eating meals with iron-containing foods:  Coffee.  Tea.  Fiber.  Soy. Foods containing vitamin C can help increase the amount of iron your body absorbs from iron sources, especially from nonheme sources. Eat foods with vitamin C along with iron-containing foods to increase your iron absorption. Foods that are high in vitamin C include many fruits and vegetables. Some good sources are:  Fresh orange juice.  Oranges.  Strawberries.  Mangoes.  Grapefruit.  Red bell peppers.  Green bell peppers.  Broccoli.  Potatoes with skin.  Tomato juice. Document Released: 04/19/2005 Document Revised: 11/28/2011 Document Reviewed: 02/24/2011 ExitCare Patient Information 2015 [employer]. This information is not intended to replace advice given to you by your health care provider. Make sure you discuss any questions you have with your health care provider.  

## 2014-09-21 MED ORDER — CALCIUM CARBONATE ANTACID 500 MG PO CHEW
2.0000 | CHEWABLE_TABLET | Freq: Three times a day (TID) | ORAL | Status: DC | PRN
  Filled 2014-09-21: qty 1

## 2014-09-21 MED ORDER — CALCIUM CARBONATE ANTACID 500 MG PO CHEW
1000.0000 mg | CHEWABLE_TABLET | Freq: Three times a day (TID) | ORAL | Status: DC | PRN
  Administered 2014-09-21: 1000 mg via ORAL

## 2014-09-21 NOTE — Lactation Note (Signed)
This note was copied from the chart of Katie Dorsey. Lactation Consultation Note  Mother feeding in cross cradle position.  Assistance received from [facility] w/ positioning. Sucks and some swallows observed.  Encouraged mother to massage to keep baby active at the breast.   Patient Name: Katie Dorsey ZOXWR'U Date: 09/21/2014 Reason for consult: Follow-up assessment;Infant weight loss   Maternal Data    Feeding Feeding Type: Breast Fed Length of feed: 23 min  LATCH Score/Interventions Latch: Grasps breast easily, tongue down, lips flanged, rhythmical sucking. (latched upon entering)  Audible Swallowing: A few with stimulation  Type of Nipple: Everted at rest and after stimulation  Comfort (Breast/Nipple): Soft / non-tender     Hold (Positioning): Assistance needed to correctly position infant at breast and maintain latch.  LATCH Score: 8  Lactation Tools Discussed/Used     Consult Status Consult Status: Follow-up Date: 09/22/14 Follow-up type: In-patient    Dahlia Byes [facility] 09/21/2014, 10:59 AM

## 2014-09-21 NOTE — Progress Notes (Signed)
Subjective: Postpartum Day 2: Cesarean Delivery Patient reports tolerating PO, + flatus and no problems voiding.    Objective: Vital signs in last 24 hours: Temp:  [97.7 F (36.5 C)-98.3 F (36.8 C)] 97.8 F (36.6 C) (01/03 0630) Pulse Rate:  [73-84] 77 (01/03 0630) Resp:  [18-19] 18 (01/03 0630) BP: (107-132)/(61-83) 107/61 mmHg (01/03 0630) SpO2:  [99 %-100 %] 100 % (01/03 0630)  Physical Exam:  General: alert, cooperative and no distress Lochia: appropriate Uterine Fundus: firm Incision: healing well DVT Evaluation: No evidence of DVT seen on physical exam.   Recent Labs  09/18/14 2300 09/20/14 0613  HGB 10.2* 9.9*  HCT 30.4* 29.2*    Assessment/Plan: Status post Cesarean section. Doing well postoperatively.  Continue current care.  Lukasz Rogus II,Hollace Michelli E 09/21/2014, 8:38 AM

## 2014-09-22 ENCOUNTER — Ambulatory Visit: Payer: Self-pay

## 2014-09-22 ENCOUNTER — Inpatient Hospital Stay (HOSPITAL_COMMUNITY): Payer: BC Managed Care – PPO

## 2014-09-22 ENCOUNTER — Encounter (HOSPITAL_COMMUNITY): Payer: Self-pay | Admitting: Obstetrics and Gynecology

## 2014-09-22 MED ORDER — IBUPROFEN 600 MG PO TABS: 600.0000 mg | ORAL_TABLET | Freq: Four times a day (QID) | ORAL | Status: DC | PRN

## 2014-09-22 MED ORDER — OXYCODONE-ACETAMINOPHEN 5-325 MG PO TABS: 1.0000 | ORAL_TABLET | ORAL | Status: DC | PRN

## 2014-09-22 NOTE — Lactation Note (Signed)
This note was copied from the chart of Boy Fariha Goto. Lactation Consultation Note     Follow up consult with this mom of an early term infant, now 55 hours old, and 38 2/7 weeks CGA. The baby is at 11% weight loss, and has some lip and tongue restrctions. i showed mom and dad my findings, and told them that if further intervention was needed, to speak to their pediatrician. Mom has been using a nipple shied, but was able to latch the baby without shield for 2 subsequent feedings, and baby did well. Mom said the latch is less painful than it has been.  I fitted mom with a 24 nipple shield, and reviewed with her hw to apply. Pumping teaching and pump care reviewed, as mom was rented a 2 week DEP. I encouraged mom to call for ans o/p lactation consult, within the next week. Mom sees her pediatrician tomorrow, and knows to call lactation for questions/concerns.   Patient Name: Boy Madoline Bhatt ZOXWR'U Date: 09/22/2014 Reason for consult: Follow-up assessment   Maternal Data    Feeding Feeding Type: Breast Fed Length of feed: 20 min  LATCH Score/Interventions Latch: Grasps breast easily, tongue down, lips flanged, rhythmical sucking. (top lip does not flange well - tight) Intervention(s): Adjust position;Assist with latch  Audible Swallowing: Spontaneous and intermittent  Type of Nipple: Everted at rest and after stimulation  Comfort (Breast/Nipple): Filling, red/small blisters or bruises, mild/mod discomfort  Problem noted: Mild/Moderate discomfort Interventions (Mild/moderate discomfort): Comfort gels;Post-pump  Hold (Positioning): Assistance needed to correctly position infant at breast and maintain latch. Intervention(s): Breastfeeding basics reviewed;Support Pillows;Position options;Skin to skin  LATCH Score: 8  Lactation Tools Discussed/Used Pump Review: Setup, frequency, and cleaning;Milk Storage;Other (comment) (hand expressin, application fo nipple shiled)   Consult  Status Consult Status: Complete Follow-up type: Call as needed    Alfred Levins 09/22/2014, 12:25 PM

## 2014-09-22 NOTE — Lactation Note (Signed)
This note was copied from the chart of Katie Jenavive Lamboy. Lactation Consultation Note Mom has DEBP and hopes to be D/C home today. D/T weight loss, unsure if will go home today. Baby has 11% weight loss, acts hungry. Mom states baby is cluster feeding and is fussy. She thinks its gassy. Baby's mouth appeared slightly dry when assessing suck. Had a good suck, has ? If baby has a tight frenulum and maybe not getting milk transfer. Mom has everted nipples, sore, tender, red. Comfort gels given. Has PCOS, and using hand pump w/easily pumped colostrum. Set up DEBP and encouraged mom to post pump for 15 min. For breast stimulation. Explained can give colostrum to baby. Mom shown how to use DEBP & how to disassemble, clean, & reassemble parts. Fitted mom w/#20 NS. Mom taught how to apply & clean nipple shield. I feel like if the weight loss is a milk transfer issue this might help. Mom states feels comfortable. Noted colostrum in NS. Baby appeared satisfied after feeding. Encouraged mom to post-pump after BF, has shells to assist nipples in everting more. Gave report to RN and LC. Patient Name: Katie Dorsey ZOXWR'U Date: 09/22/2014 Reason for consult: Follow-up assessment;Infant weight loss   Maternal Data    Feeding Feeding Type: Breast Fed Length of feed: 10 min  LATCH Score/Interventions Latch: Grasps breast easily, tongue down, lips flanged, rhythmical sucking. Intervention(s): Adjust position;Assist with latch;Breast massage;Breast compression  Audible Swallowing: Spontaneous and intermittent Intervention(s): Skin to skin;Hand expression;Alternate breast massage  Type of Nipple: Everted at rest and after stimulation Intervention(s): Double electric pump;Shells  Comfort (Breast/Nipple): Filling, red/small blisters or bruises, mild/mod discomfort  Problem noted: Mild/Moderate discomfort Interventions (Mild/moderate discomfort): Post-pump;Comfort gels;Breast shields;Hand expression;Hand  massage  Hold (Positioning): Assistance needed to correctly position infant at breast and maintain latch. Intervention(s): Breastfeeding basics reviewed;Support Pillows;Position options;Skin to skin  LATCH Score: 8  Lactation Tools Discussed/Used Tools: Shells;Nipple Shields;Pump;Comfort gels Nipple shield size: 20 Shell Type: Inverted Breast pump type: Double-Electric Breast Pump Pump Review: Setup, frequency, and cleaning;Milk Storage Initiated by:: Peri Jefferson RN Date initiated:: 09/22/14   Consult Status Consult Status: Complete Follow-up type: In-patient    Charyl Dancer 09/22/2014, 8:31 AM

## 2014-09-22 NOTE — Discharge Summary (Signed)
Obstetric Discharge Summary Reason for Admission: induction of labor Prenatal Procedures: ultrasound Intrapartum Procedures: cesarean: low cervical, transverse Postpartum Procedures: none Complications-Operative and Postpartum: none HEMOGLOBIN  Date Value Ref Range Status  09/20/2014 9.9* 12.0 - 15.0 g/dL Final   HCT  Date Value Ref Range Status  09/20/2014 29.2* 36.0 - 46.0 % Final    Physical Exam:  General: alert and cooperative Lochia: appropriate Uterine Fundus: firm Incision: healing well DVT Evaluation: No evidence of DVT seen on physical exam. Negative Homan's sign. No cords or calf tenderness. Calf/Ankle edema is present.  Discharge Diagnoses: Term Pregnancy-delivered  Discharge Information: Date: 09/22/2014 Activity: pelvic rest Diet: routine Medications: PNV, Ibuprofen and Percocet Condition: stable Instructions: refer to practice specific booklet Discharge to: home   Newborn Data: Live born female  Birth Weight: 7 lb 8.6 oz (3419 g) APGAR: 7, 9  Home with mother.  CURTIS,CAROL G 09/22/2014, 8:49 AM

## 2014-10-28 ENCOUNTER — Encounter: Payer: Self-pay | Admitting: Nurse Practitioner

## 2014-11-12 ENCOUNTER — Other Ambulatory Visit: Payer: Self-pay | Admitting: Obstetrics and Gynecology

## 2014-11-13 LAB — CYTOLOGY - PAP

## 2015-09-16 ENCOUNTER — Ambulatory Visit (INDEPENDENT_AMBULATORY_CARE_PROVIDER_SITE_OTHER): Payer: BLUE CROSS/BLUE SHIELD | Admitting: Physician Assistant

## 2015-09-16 ENCOUNTER — Ambulatory Visit (INDEPENDENT_AMBULATORY_CARE_PROVIDER_SITE_OTHER): Payer: BLUE CROSS/BLUE SHIELD

## 2015-09-16 VITALS — BP 128/80 | HR 85 | Temp 98.0°F | Resp 16 | Ht 68.0 in | Wt 258.0 lb

## 2015-09-16 DIAGNOSIS — M545 Low back pain, unspecified: Secondary | ICD-10-CM

## 2015-09-16 MED ORDER — CYCLOBENZAPRINE HCL 10 MG PO TABS: 5.0000 mg | ORAL_TABLET | Freq: Three times a day (TID) | ORAL | Status: DC | PRN

## 2015-09-16 MED ORDER — IBUPROFEN 800 MG PO TABS: 800.0000 mg | ORAL_TABLET | Freq: Three times a day (TID) | ORAL | Status: AC
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
## 2016-02-23 ENCOUNTER — Telehealth: Payer: Self-pay

## 2016-02-23 NOTE — Telephone Encounter (Signed)
Patient is coming in today to pick up a copy of her x-ray. She is aware she'll have to fill out a release.

## 2018-11-08 LAB — OB RESULTS CONSOLE GC/CHLAMYDIA
Chlamydia: NEGATIVE
Gonorrhea: NEGATIVE

## 2018-11-08 LAB — OB RESULTS CONSOLE ABO/RH: RH Type: POSITIVE

## 2018-11-08 LAB — OB RESULTS CONSOLE ANTIBODY SCREEN: Antibody Screen: NEGATIVE

## 2018-11-08 LAB — OB RESULTS CONSOLE HEPATITIS B SURFACE ANTIGEN: Hepatitis B Surface Ag: NEGATIVE

## 2018-11-08 LAB — OB RESULTS CONSOLE HIV ANTIBODY (ROUTINE TESTING): HIV: NONREACTIVE

## 2018-11-08 LAB — OB RESULTS CONSOLE RUBELLA ANTIBODY, IGM: Rubella: IMMUNE

## 2018-11-08 LAB — OB RESULTS CONSOLE RPR: RPR: NONREACTIVE

## 2019-03-20 ENCOUNTER — Encounter: Payer: BC Managed Care – PPO | Attending: Obstetrics and Gynecology | Admitting: *Deleted

## 2019-03-20 ENCOUNTER — Other Ambulatory Visit: Payer: Self-pay

## 2019-03-21 NOTE — Progress Notes (Signed)
  This visit was completed via telephone due to the COVID-19 pandemic.   I spoke with Katie Dorsey and verified that I was speaking with the correct person with two patient identifiers (full name and date of birth).   I discussed the limitations related to this kind of visit and the patient is willing to proceed. I was able to e-mail her the GDM packet during our visit so she could refer to it during the appointment.   Patient was seen on 03/20/2019 for Gestational Diabetes self-management. EDD mid September, 2020. Patient states no history of GDM. Diet history obtained. Patient eats good  variety of all food groups. Beverages include water, decaffeinated beverages, unsweet tea.  The following learning objectives were met by the patient :   States the definition of Gestational Diabetes  States why dietary management is important in controlling blood glucose  Describes the effects of carbohydrates on blood glucose levels  Demonstrates ability to create a balanced meal plan  Demonstrates carbohydrate counting   States when to check blood glucose levels  Demonstrates proper blood glucose monitoring techniques  States the effect of stress and exercise on blood glucose levels  States the importance of limiting caffeine and abstaining from alcohol and smoking  Plan:   Aim for 3 Carb Choices per meal (45 grams) +/- 1 either way   Aim for 1-2 Carbs per snack  Begin reading food labels for Total Carbohydrate of foods  If OK with your MD, consider  increasing your activity level by walking, Arm Chair Exercises or other activity daily as tolerated  Begin checking BG before breakfast and 2 hours after first bite of breakfast, lunch and dinner as directed by MD Bring Log Book/Sheet and meter to every medical appointment   Take medication if directed by MD  Blood glucose monitor has been ordered by the patient based on insurance coverage. It should arrive in the next few days. Patient  instructed to test pre breakfast and 2 hours each meal as directed by MD  Patient instructed to monitor glucose levels: FBS: 60 - 95 mg/dl 2 hour: <120 mg/dl  Patient received the following handouts: via e-mail  Nutrition Diabetes and Pregnancy  Carbohydrate Counting List  BG Log Sheet  Patient will be seen for follow-up as needed.

## 2019-05-20 LAB — OB RESULTS CONSOLE GBS: GBS: NEGATIVE

## 2019-05-28 ENCOUNTER — Encounter (HOSPITAL_COMMUNITY): Payer: Self-pay

## 2019-05-28 ENCOUNTER — Telehealth (HOSPITAL_COMMUNITY): Payer: Self-pay | Admitting: *Deleted

## 2019-05-28 NOTE — Patient Instructions (Signed)
Dylann Layne  05/28/2019   Your procedure is scheduled on:  06/07/2019  Arrive at 55 at Entrance C on [address] at [facility]  and [employer]. You are invited to use the FREE valet parking or use the Visitor's parking deck.  Pick up the phone at the desk and dial [phone].  Call this number if you have problems the morning of surgery: [phone]  Remember:   Do not eat food:(After Midnight) Desps de medianoche.  Do not drink clear liquids: (After Midnight) Desps de medianoche.  Take these medicines the morning of surgery with A SIP OF WATER:  zoloft   Do not wear jewelry, make-up or nail polish.  Do not wear lotions, powders, or perfumes. Do not wear deodorant.  Do not shave 48 hours prior to surgery.  Do not bring valuables to the hospital.  [facility] is not   responsible for any belongings or valuables brought to the hospital.  Contacts, dentures or bridgework may not be worn into surgery.  Leave suitcase in the car. After surgery it may be brought to your room.  For patients admitted to the hospital, checkout time is 11:00 AM the day of              discharge.      Please read over the following fact sheets that you were given:     Preparing for Surgery

## 2019-05-31 ENCOUNTER — Inpatient Hospital Stay (HOSPITAL_COMMUNITY)
Admission: AD | Admit: 2019-05-31 | Discharge: 2019-05-31 | Disposition: A | Discharge status: Home / Self Care | Payer: BC Managed Care – PPO | Attending: Obstetrics and Gynecology | Admitting: Obstetrics and Gynecology

## 2019-05-31 ENCOUNTER — Encounter (HOSPITAL_COMMUNITY): Payer: Self-pay

## 2019-05-31 ENCOUNTER — Other Ambulatory Visit: Payer: Self-pay

## 2019-05-31 DIAGNOSIS — Z3689 Encounter for other specified antenatal screening: Secondary | ICD-10-CM | POA: Diagnosis not present

## 2019-05-31 DIAGNOSIS — Z3A39 39 weeks gestation of pregnancy: Secondary | ICD-10-CM | POA: Diagnosis not present

## 2019-05-31 DIAGNOSIS — O368330 Maternal care for abnormalities of the fetal heart rate or rhythm, third trimester, not applicable or unspecified: Secondary | ICD-10-CM | POA: Diagnosis present

## 2019-05-31 LAB — URINALYSIS, ROUTINE W REFLEX MICROSCOPIC
Bilirubin Urine: NEGATIVE
Glucose, UA: NEGATIVE mg/dL
Hgb urine dipstick: NEGATIVE
Ketones, ur: NEGATIVE mg/dL
Leukocytes,Ua: NEGATIVE
Nitrite: NEGATIVE
Protein, ur: NEGATIVE mg/dL
Specific Gravity, Urine: 1.018 (ref 1.005–1.030)
pH: 6 (ref 5.0–8.0)

## 2019-05-31 NOTE — Discharge Instructions (Signed)
Third Trimester of Pregnancy The third trimester is from week 28 through week 40 (months 7 through 9). The third trimester is a time when the unborn baby (fetus) is growing rapidly. At the end of the ninth month, the fetus is about 20 inches in length and weighs 6-10 pounds. Body changes during your third trimester Your body will continue to go through many changes during pregnancy. The changes vary from woman to woman. During the third trimester:  Your weight will continue to increase. You can expect to gain 25-35 pounds (11-16 kg) by the end of the pregnancy.  You may begin to get stretch marks on your hips, abdomen, and breasts.  You may urinate more often because the fetus is moving lower into your pelvis and pressing on your bladder.  You may develop or continue to have heartburn. This is caused by increased hormones that slow down muscles in the digestive tract.  You may develop or continue to have constipation because increased hormones slow digestion and cause the muscles that push waste through your intestines to relax.  You may develop hemorrhoids. These are swollen veins (varicose veins) in the rectum that can itch or be painful.  You may develop swollen, bulging veins (varicose veins) in your legs.  You may have increased body aches in the pelvis, back, or thighs. This is due to weight gain and increased hormones that are relaxing your joints.  You may have changes in your hair. These can include thickening of your hair, rapid growth, and changes in texture. Some women also have hair loss during or after pregnancy, or hair that feels dry or thin. Your hair will most likely return to normal after your baby is born.  Your breasts will continue to grow and they will continue to become tender. A yellow fluid (colostrum) may leak from your breasts. This is the first milk you are producing for your baby.  Your belly button may stick out.  You may notice more swelling in your hands,  face, or ankles.  You may have increased tingling or numbness in your hands, arms, and legs. The skin on your belly may also feel numb.  You may feel short of breath because of your expanding uterus.  You may have more problems sleeping. This can be caused by the size of your belly, increased need to urinate, and an increase in your body's metabolism.  You may notice the fetus "dropping," or moving lower in your abdomen (lightening).  You may have increased vaginal discharge.  You may notice your joints feel loose and you may have pain around your pelvic bone. What to expect at prenatal visits You will have prenatal exams every 2 weeks until week 36. Then you will have weekly prenatal exams. During a routine prenatal visit:  You will be weighed to make sure you and the baby are growing normally.  Your blood pressure will be taken.  Your abdomen will be measured to track your baby's growth.  The fetal heartbeat will be listened to.  Any test results from the previous visit will be discussed.  You may have a cervical check near your due date to see if your cervix has softened or thinned (effaced).  You will be tested for Group B streptococcus. This happens between 35 and 37 weeks. Your health care provider may ask you:  What your birth plan is.  How you are feeling.  If you are feeling the baby move.  If you have had any abnormal   symptoms, such as leaking fluid, bleeding, severe headaches, or abdominal cramping.  If you are using any tobacco products, including cigarettes, chewing tobacco, and electronic cigarettes.  If you have any questions. Other tests or screenings that may be performed during your third trimester include:  Blood tests that check for low iron levels (anemia).  Fetal testing to check the health, activity level, and growth of the fetus. Testing is done if you have certain medical conditions or if there are problems during the pregnancy.  Nonstress test  (NST). This test checks the health of your baby to make sure there are no signs of problems, such as the baby not getting enough oxygen. During this test, a belt is placed around your belly. The baby is made to move, and its heart rate is monitored during movement. What is false labor? False labor is a condition in which you feel small, irregular tightenings of the muscles in the womb (contractions) that usually go away with rest, changing position, or drinking water. These are called Braxton Hicks contractions. Contractions may last for hours, days, or even weeks before true labor sets in. If contractions come at regular intervals, become more frequent, increase in intensity, or become painful, you should see your health care provider. What are the signs of labor?  Abdominal cramps.  Regular contractions that start at 10 minutes apart and become stronger and more frequent with time.  Contractions that start on the top of the uterus and spread down to the lower abdomen and back.  Increased pelvic pressure and dull back pain.  A watery or bloody mucus discharge that comes from the vagina.  Leaking of amniotic fluid. This is also known as your "water breaking." It could be a slow trickle or a gush. Let your health care provider know if it has a color or strange odor. If you have any of these signs, call your health care provider right away, even if it is before your due date. Follow these instructions at home: Medicines  Follow your health care provider's instructions regarding medicine use. Specific medicines may be either safe or unsafe to take during pregnancy.  Take a prenatal vitamin that contains at least 600 micrograms (mcg) of folic acid.  If you develop constipation, try taking a stool softener if your health care provider approves. Eating and drinking   Eat a balanced diet that includes fresh fruits and vegetables, whole grains, good sources of protein such as meat, eggs, or tofu,  and low-fat dairy. Your health care provider will help you determine the amount of weight gain that is right for you.  Avoid raw meat and uncooked cheese. These carry germs that can cause birth defects in the baby.  If you have low calcium intake from food, talk to your health care provider about whether you should take a daily calcium supplement.  Eat four or five small meals rather than three large meals a day.  Limit foods that are high in fat and processed sugars, such as fried and sweet foods.  To prevent constipation: ? Drink enough fluid to keep your urine clear or pale yellow. ? Eat foods that are high in fiber, such as fresh fruits and vegetables, whole grains, and beans. Activity  Exercise only as directed by your health care provider. Most women can continue their usual exercise routine during pregnancy. Try to exercise for 30 minutes at least 5 days a week. Stop exercising if you experience uterine contractions.  Avoid heavy lifting.  Do   not exercise in extreme heat or humidity, or at high altitudes.  Wear low-heel, comfortable shoes.  Practice good posture.  You may continue to have sex unless your health care provider tells you otherwise. Relieving pain and discomfort  Take frequent breaks and rest with your legs elevated if you have leg cramps or low back pain.  Take warm sitz baths to soothe any pain or discomfort caused by hemorrhoids. Use hemorrhoid cream if your health care provider approves.  Wear a good support bra to prevent discomfort from breast tenderness.  If you develop varicose veins: ? Wear support pantyhose or compression stockings as told by your healthcare provider. ? Elevate your feet for 15 minutes, 3-4 times a day. Prenatal care  Write down your questions. Take them to your prenatal visits.  Keep all your prenatal visits as told by your health care provider. This is important. Safety  Wear your seat belt at all times when driving.  Make  a list of emergency phone numbers, including numbers for family, friends, the hospital, and police and fire departments. General instructions  Avoid cat litter boxes and soil used by cats. These carry germs that can cause birth defects in the baby. If you have a cat, ask someone to clean the litter box for you.  Do not travel far distances unless it is absolutely necessary and only with the approval of your health care provider.  Do not use hot tubs, steam rooms, or saunas.  Do not drink alcohol.  Do not use any products that contain nicotine or tobacco, such as cigarettes and e-cigarettes. If you need help quitting, ask your health care provider.  Do not use any medicinal herbs or unprescribed drugs. These chemicals affect the formation and growth of the baby.  Do not douche or use tampons or scented sanitary pads.  Do not cross your legs for long periods of time.  To prepare for the arrival of your baby: ? Take prenatal classes to understand, practice, and ask questions about labor and delivery. ? Make a trial run to the hospital. ? Visit the hospital and tour the maternity area. ? Arrange for maternity or paternity leave through employers. ? Arrange for family and friends to take care of pets while you are in the hospital. ? Purchase a rear-facing car seat and make sure you know how to install it in your car. ? Pack your hospital bag. ? Prepare the baby's nursery. Make sure to remove all pillows and stuffed animals from the baby's crib to prevent suffocation.  Visit your dentist if you have not gone during your pregnancy. Use a soft toothbrush to brush your teeth and be gentle when you floss. Contact a health care provider if:  You are unsure if you are in labor or if your water has broken.  You become dizzy.  You have mild pelvic cramps, pelvic pressure, or nagging pain in your abdominal area.  You have lower back pain.  You have persistent nausea, vomiting, or diarrhea.   You have an unusual or bad smelling vaginal discharge.  You have pain when you urinate. Get help right away if:  Your water breaks before 37 weeks.  You have regular contractions less than 5 minutes apart before 37 weeks.  You have a fever.  You are leaking fluid from your vagina.  You have spotting or bleeding from your vagina.  You have severe abdominal pain or cramping.  You have rapid weight loss or weight gain.  You have   shortness of breath with chest pain.  You notice sudden or extreme swelling of your face, hands, ankles, feet, or legs.  Your baby makes fewer than 10 movements in 2 hours.  You have severe headaches that do not go away when you take medicine.  You have vision changes. Summary  The third trimester is from week 28 through week 40, months 7 through 9. The third trimester is a time when the unborn baby (fetus) is growing rapidly.  During the third trimester, your discomfort may increase as you and your baby continue to gain weight. You may have abdominal, leg, and back pain, sleeping problems, and an increased need to urinate.  During the third trimester your breasts will keep growing and they will continue to become tender. A yellow fluid (colostrum) may leak from your breasts. This is the first milk you are producing for your baby.  False labor is a condition in which you feel small, irregular tightenings of the muscles in the womb (contractions) that eventually go away. These are called Braxton Hicks contractions. Contractions may last for hours, days, or even weeks before true labor sets in.  Signs of labor can include: abdominal cramps; regular contractions that start at 10 minutes apart and become stronger and more frequent with time; watery or bloody mucus discharge that comes from the vagina; increased pelvic pressure and dull back pain; and leaking of amniotic fluid. This information is not intended to replace advice given to you by your health  care provider. Make sure you discuss any questions you have with your health care provider. Document Released: 08/30/2001 Document Revised: 12/27/2018 Document Reviewed: 10/11/2016 Elsevier Patient Education  2020 [employer]  

## 2019-05-31 NOTE — MAU Note (Signed)
Pt states she is feeling baby move. Marilynne Drivers, RN

## 2019-05-31 NOTE — MAU Note (Signed)
Pt stated she was at a normal OB app and the fetal HR was too high, so she was sent here to be evaluated further. Marilynne Drivers, RN

## 2019-05-31 NOTE — MAU Note (Signed)
Not enough urine for grey top

## 2019-05-31 NOTE — MAU Provider Note (Signed)
First Provider Initiated Contact with Patient 05/31/19 1701       S: Ms. Katie Dorsey is a 33 y.o. G2P1001 at [redacted]w[redacted]d  who presents to MAU today for NST. She was seen in the office today and FHR with doppler was 170. She was placed on the monitor in the office, and FHR remained high. Patient reports that fetus was very active during the NST. She was sent here for further monitoring. She denies any contractions, VB or LOF. She reports normal fetal movement. Patient has a repeat c-section scheduled, but she would like to have a VBAC. She was hoping to discuss this today with her provider, but she didn't get the chance.   O: BP 130/81   Pulse 84   Temp 98.6 F (37 C)   Resp 18   Ht 5\' 8"  (1.727 m)   Wt 121.8 kg   LMP 08/28/2018   SpO2 99%   BMI 40.84 kg/m  GENERAL: Well-developed, well-nourished female in no acute distress.  HEAD: Normocephalic, atraumatic.  CHEST: Normal effort of breathing, regular heart rate ABDOMEN: Soft, nontender, gravid   Cervical exam:   deferred    Fetal Monitoring: Baseline: 150 Variability: moderate Accelerations: 15x15 Decelerations: none Contractions: none  Results for orders placed or performed during the hospital encounter of 05/31/19 (from the past 24 hour(s))  Urinalysis, Routine w reflex microscopic     Status: Abnormal   Collection Time: 05/31/19  4:56 PM  Result Value Ref Range   Color, Urine YELLOW YELLOW   APPearance HAZY (A) CLEAR   Specific Gravity, Urine 1.018 1.005 - 1.030   pH 6.0 5.0 - 8.0   Glucose, UA NEGATIVE NEGATIVE mg/dL   Hgb urine dipstick NEGATIVE NEGATIVE   Bilirubin Urine NEGATIVE NEGATIVE   Ketones, ur NEGATIVE NEGATIVE mg/dL   Protein, ur NEGATIVE NEGATIVE mg/dL   Nitrite NEGATIVE NEGATIVE   Leukocytes,Ua NEGATIVE NEGATIVE     A: 1. NST (non-stress test) reactive   2. [redacted] weeks gestation of pregnancy      P: DC home 3rd Trimester precautions  labor precautions  Fetal kick counts RX: none  Return to  MAU as needed FU with OB as planned  Follow-up Information    [facility] Follow up.   Contact information: [address]  [address] [place] [address] [phone]          Heather Hogan DNP, CNM  05/31/19  5:39 PM

## 2019-06-04 NOTE — H&P (Signed)
Katie Dorsey is a 33 y.35o.G2P2002 female presenting for scheduled repeat cesarean section. Pt is dated per LMP which was confirmed with an 8 week US. Her pregnancy has been complicated by: mixed anxiety /depression - stable on 25mg  zoloft, GDMA1, Hx of gestational hypertension - was on asa; BPs stable.  Hx cesarean section - had wanted VBAC but cervix closed, thick and high. Baby with recent bout of tachycardia - baseline 180s- resolved. BPP 8/8 9/14. GBS negative. Stable asthma and migraines. She is AMA - declined panorama screen  OB History    Gravida  2   Para  1   Term  1   Preterm      AB      Living  1     SAB      TAB      Ectopic      Multiple  0   Live Births  1          Past Medical History:  Diagnosis Date  . Abnormal Pap smear 2006   "mild dysplasia", colpo negative  . Anxiety   . Asthma, exercise induced   . Complication of anesthesia   . Depression   . Ovarian cyst rupture 2012  . PCOS (polycystic ovarian syndrome) 2011  . Vaginal Pap smear, abnormal    Past Surgical History:  Procedure Laterality Date  . CESAREAN SECTION N/A 09/19/2014   Procedure: CESAREAN SECTION;  Surgeon: Leslie AndreaJames E Tomblin II, MD;  Location: [facility];  Service: Obstetrics;  Laterality: N/A;  . COLPOSCOPY  2006   negative  . GANGLION CYST EXCISION Right 2004   wrist  . TONSILLECTOMY  1995   Family History: family history includes Cancer in her maternal aunt; Colon cancer in her maternal grandmother; Diabetes in her maternal uncle and maternal uncle; HIV in her father; Heart disease in her maternal grandfather; Hyperlipidemia in her maternal grandfather; Hypertension in her mother. Social History:  reports that she quit smoking about 6 years ago. Her smoking use included cigarettes. She has a 3.25 pack-year smoking history. She has never used smokeless tobacco. She reports that she does not drink alcohol or use drugs.     Maternal Diabetes: Yes:  Diabetes Type:  Diet  controlled Genetic Screening: Declined Maternal Ultrasounds/Referrals: Normal Fetal Ultrasounds or other Referrals:  None Maternal Substance Abuse:  No Significant Maternal Medications:  None Significant Maternal Lab Results:  Group B Strep negative Other Comments:  None  Review of Systems  Constitutional: Negative for chills, fever, malaise/fatigue and weight loss.  Eyes: Negative for blurred vision and double vision.  Respiratory: Negative for shortness of breath.   Cardiovascular: Positive for leg swelling. Negative for chest pain.  Gastrointestinal: Positive for abdominal pain. Negative for heartburn, nausea and vomiting.  Genitourinary: Negative for dysuria.  Musculoskeletal: Negative for myalgias.  Skin: Negative for itching and rash.  Neurological: Negative for dizziness and headaches.  Endo/Heme/Allergies: Does not bruise/bleed easily.  Psychiatric/Behavioral: Negative for depression, hallucinations, substance abuse and suicidal ideas. The patient is nervous/anxious.    Maternal Medical History:  Reason for admission: Nausea. Scheduled repeat cesarean section  Contractions: Frequency: rare.    Fetal activity: Perceived fetal activity is normal.   Last perceived fetal movement was within the past 12 hours.        Last menstrual period 08/28/2018, unknown if currently breastfeeding. Exam Physical Exam  Prenatal labs: ABO, Rh: A/Positive/-- (02/20 0000) Antibody: Negative (02/20 0000) Rubella: Immune (02/20 0000) RPR: Nonreactive (02/20 0000)  HBsAg:  Negative (02/20 0000)  HIV: Non-reactive (02/20 0000)  GBS: Negative/-- (08/31 0000)   Assessment/Plan: 33yo G 2P2002 female here for repeat cesarean section at term Kemp Consent verified Check CBC and LFTs Clinda/Gent pre-op To OR when ready  Venetia Night Katie Dorsey 06/04/2019, 3:16 AM

## 2019-06-05 ENCOUNTER — Other Ambulatory Visit (HOSPITAL_COMMUNITY)
Admission: RE | Admit: 2019-06-05 | Discharge: 2019-06-05 | Disposition: A | Discharge status: Home / Self Care | Payer: BC Managed Care – PPO | Source: Ambulatory Visit | Attending: Obstetrics and Gynecology | Admitting: Obstetrics and Gynecology

## 2019-06-05 ENCOUNTER — Other Ambulatory Visit: Payer: Self-pay

## 2019-06-05 DIAGNOSIS — Z01812 Encounter for preprocedural laboratory examination: Secondary | ICD-10-CM | POA: Diagnosis present

## 2019-06-05 DIAGNOSIS — Z20828 Contact with and (suspected) exposure to other viral communicable diseases: Secondary | ICD-10-CM | POA: Insufficient documentation

## 2019-06-05 HISTORY — DX: Other complications of anesthesia, initial encounter: T88.59XA

## 2019-06-05 HISTORY — DX: Unspecified abnormal cytological findings in specimens from vagina: R87.629

## 2019-06-05 LAB — TYPE AND SCREEN
ABO/RH(D): A POS
Antibody Screen: NEGATIVE

## 2019-06-05 LAB — CBC
HCT: 33.4 % — ABNORMAL LOW (ref 36.0–46.0)
Hemoglobin: 10.9 g/dL — ABNORMAL LOW (ref 12.0–15.0)
MCH: 28.8 pg (ref 26.0–34.0)
MCHC: 32.6 g/dL (ref 30.0–36.0)
MCV: 88.1 fL (ref 80.0–100.0)
Platelets: 299 10*3/uL (ref 150–400)
RBC: 3.79 MIL/uL — ABNORMAL LOW (ref 3.87–5.11)
RDW: 15.3 % (ref 11.5–15.5)
WBC: 8.7 10*3/uL (ref 4.0–10.5)
nRBC: 0 % (ref 0.0–0.2)

## 2019-06-05 LAB — SARS CORONAVIRUS 2 BY RT PCR (HOSPITAL ORDER, PERFORMED IN ~~LOC~~ HOSPITAL LAB): SARS Coronavirus 2: NEGATIVE

## 2019-06-05 LAB — RPR: RPR Ser Ql: NONREACTIVE

## 2019-06-05 LAB — ABO/RH: ABO/RH(D): A POS

## 2019-06-05 NOTE — MAU Note (Signed)
Covid swab collected. Pt tolerated well. Pt asymptomatic 

## 2019-06-07 ENCOUNTER — Inpatient Hospital Stay (HOSPITAL_COMMUNITY)
Admission: RE | Admit: 2019-06-07 | Discharge: 2019-06-09 | DRG: 788 | Disposition: A | Discharge status: Home / Self Care | Payer: BC Managed Care – PPO | Attending: Obstetrics and Gynecology | Admitting: Obstetrics and Gynecology

## 2019-06-07 ENCOUNTER — Inpatient Hospital Stay (HOSPITAL_COMMUNITY): Payer: BC Managed Care – PPO | Admitting: Anesthesiology

## 2019-06-07 ENCOUNTER — Other Ambulatory Visit: Payer: Self-pay

## 2019-06-07 ENCOUNTER — Encounter (HOSPITAL_COMMUNITY): Payer: Self-pay | Admitting: *Deleted

## 2019-06-07 ENCOUNTER — Encounter (HOSPITAL_COMMUNITY)
Admission: RE | Disposition: A | Discharge status: Home / Self Care | Payer: Self-pay | Source: Home / Self Care | Attending: Obstetrics and Gynecology

## 2019-06-07 DIAGNOSIS — O24420 Gestational diabetes mellitus in childbirth, diet controlled: Secondary | ICD-10-CM | POA: Diagnosis present

## 2019-06-07 DIAGNOSIS — O9952 Diseases of the respiratory system complicating childbirth: Secondary | ICD-10-CM | POA: Diagnosis present

## 2019-06-07 DIAGNOSIS — D649 Anemia, unspecified: Secondary | ICD-10-CM | POA: Diagnosis present

## 2019-06-07 DIAGNOSIS — Z3A40 40 weeks gestation of pregnancy: Secondary | ICD-10-CM | POA: Diagnosis not present

## 2019-06-07 DIAGNOSIS — O9902 Anemia complicating childbirth: Secondary | ICD-10-CM | POA: Diagnosis present

## 2019-06-07 DIAGNOSIS — J45909 Unspecified asthma, uncomplicated: Secondary | ICD-10-CM | POA: Diagnosis present

## 2019-06-07 DIAGNOSIS — Z98891 History of uterine scar from previous surgery: Secondary | ICD-10-CM

## 2019-06-07 DIAGNOSIS — F329 Major depressive disorder, single episode, unspecified: Secondary | ICD-10-CM | POA: Diagnosis present

## 2019-06-07 DIAGNOSIS — O99344 Other mental disorders complicating childbirth: Secondary | ICD-10-CM | POA: Diagnosis present

## 2019-06-07 DIAGNOSIS — O34211 Maternal care for low transverse scar from previous cesarean delivery: Principal | ICD-10-CM | POA: Diagnosis present

## 2019-06-07 DIAGNOSIS — Z87891 Personal history of nicotine dependence: Secondary | ICD-10-CM | POA: Diagnosis not present

## 2019-06-07 DIAGNOSIS — F419 Anxiety disorder, unspecified: Secondary | ICD-10-CM | POA: Diagnosis present

## 2019-06-07 DIAGNOSIS — Z3490 Encounter for supervision of normal pregnancy, unspecified, unspecified trimester: Secondary | ICD-10-CM

## 2019-06-07 HISTORY — DX: History of uterine scar from previous surgery: Z98.891

## 2019-06-07 LAB — HEPATIC FUNCTION PANEL
ALT: 13 U/L (ref 0–44)
AST: 14 U/L — ABNORMAL LOW (ref 15–41)
Albumin: 2.6 g/dL — ABNORMAL LOW (ref 3.5–5.0)
Alkaline Phosphatase: 180 U/L — ABNORMAL HIGH (ref 38–126)
Bilirubin, Direct: <0.1 mg/dL (ref 0.0–0.2)
Total Bilirubin: 0.3 mg/dL (ref 0.3–1.2)
Total Protein: 5.9 g/dL — ABNORMAL LOW (ref 6.5–8.1)

## 2019-06-07 LAB — GLUCOSE, CAPILLARY
Glucose-Capillary: 75 mg/dL (ref 70–99)
Glucose-Capillary: 72 mg/dL (ref 70–99)

## 2019-06-07 SURGERY — Surgical Case
Anesthesia: Spinal | Site: Abdomen

## 2019-06-07 MED ORDER — PRENATAL MULTIVITAMIN CH
1.0000 | ORAL_TABLET | Freq: Every day | ORAL | Status: DC
  Administered 2019-06-08 – 2019-06-09 (×2): 1 via ORAL
  Filled 2019-06-07 (×2): qty 1

## 2019-06-07 MED ORDER — OXYTOCIN 40 UNITS IN NORMAL SALINE INFUSION - SIMPLE MED
INTRAVENOUS | Status: AC
  Filled 2019-06-07: qty 1000

## 2019-06-07 MED ORDER — ZOLPIDEM TARTRATE 5 MG PO TABS: 5.0000 mg | ORAL_TABLET | Freq: Every evening | ORAL | Status: DC | PRN

## 2019-06-07 MED ORDER — MORPHINE SULFATE (PF) 0.5 MG/ML IJ SOLN
INTRAMUSCULAR | Status: AC
  Filled 2019-06-07: qty 10

## 2019-06-07 MED ORDER — OXYTOCIN 10 UNIT/ML IJ SOLN
INTRAMUSCULAR | Status: DC | PRN
  Administered 2019-06-07: 40 [IU]

## 2019-06-07 MED ORDER — ACETAMINOPHEN 500 MG PO TABS
1000.0000 mg | ORAL_TABLET | ORAL | Status: AC
  Administered 2019-06-07: 1000 mg via ORAL

## 2019-06-07 MED ORDER — SIMETHICONE 80 MG PO CHEW
80.0000 mg | CHEWABLE_TABLET | ORAL | Status: DC
  Administered 2019-06-07 – 2019-06-08 (×2): 80 mg via ORAL
  Filled 2019-06-07 (×2): qty 1

## 2019-06-07 MED ORDER — DIPHENHYDRAMINE HCL 50 MG/ML IJ SOLN: 12.5000 mg | Freq: Four times a day (QID) | INTRAMUSCULAR | Status: DC | PRN

## 2019-06-07 MED ORDER — WITCH HAZEL-GLYCERIN EX PADS: 1.0000 "application " | MEDICATED_PAD | CUTANEOUS | Status: DC | PRN

## 2019-06-07 MED ORDER — DIPHENHYDRAMINE HCL 25 MG PO CAPS: 25.0000 mg | ORAL_CAPSULE | ORAL | Status: DC | PRN

## 2019-06-07 MED ORDER — LACTATED RINGERS IV SOLN
INTRAVENOUS | Status: DC
  Administered 2019-06-07 – 2019-06-08 (×2): via INTRAVENOUS

## 2019-06-07 MED ORDER — SOD CITRATE-CITRIC ACID 500-334 MG/5ML PO SOLN
30.0000 mL | ORAL | Status: AC
  Administered 2019-06-07: 30 mL via ORAL

## 2019-06-07 MED ORDER — OXYCODONE HCL 5 MG/5ML PO SOLN: 5.0000 mg | Freq: Once | ORAL | Status: DC | PRN

## 2019-06-07 MED ORDER — MORPHINE SULFATE (PF) 0.5 MG/ML IJ SOLN
INTRAMUSCULAR | Status: DC | PRN
  Administered 2019-06-07: 150 ug via INTRATHECAL

## 2019-06-07 MED ORDER — ONDANSETRON HCL 4 MG/2ML IJ SOLN
INTRAMUSCULAR | Status: AC
  Filled 2019-06-07: qty 2

## 2019-06-07 MED ORDER — SCOPOLAMINE 1 MG/3DAYS TD PT72: 1.0000 | MEDICATED_PATCH | Freq: Once | TRANSDERMAL | Status: DC

## 2019-06-07 MED ORDER — FAMOTIDINE 20 MG PO TABS
ORAL_TABLET | ORAL | Status: AC
  Filled 2019-06-07: qty 1

## 2019-06-07 MED ORDER — DIBUCAINE (PERIANAL) 1 % EX OINT: 1.0000 "application " | TOPICAL_OINTMENT | CUTANEOUS | Status: DC | PRN

## 2019-06-07 MED ORDER — KETOROLAC TROMETHAMINE 30 MG/ML IJ SOLN
30.0000 mg | Freq: Once | INTRAMUSCULAR | Status: AC | PRN
  Administered 2019-06-07: 30 mg via INTRAVENOUS

## 2019-06-07 MED ORDER — IBUPROFEN 800 MG PO TABS
800.0000 mg | ORAL_TABLET | Freq: Three times a day (TID) | ORAL | Status: DC
  Administered 2019-06-07 – 2019-06-09 (×6): 800 mg via ORAL
  Filled 2019-06-07 (×6): qty 1

## 2019-06-07 MED ORDER — HYDROMORPHONE HCL 1 MG/ML IJ SOLN
0.2500 mg | INTRAMUSCULAR | Status: DC | PRN
  Administered 2019-06-07: 0.25 mg via INTRAVENOUS

## 2019-06-07 MED ORDER — PHENYLEPHRINE HCL (PRESSORS) 10 MG/ML IV SOLN
INTRAVENOUS | Status: DC | PRN
  Administered 2019-06-07: 80 ug via INTRAVENOUS

## 2019-06-07 MED ORDER — DIPHENHYDRAMINE HCL 25 MG PO CAPS: 25.0000 mg | ORAL_CAPSULE | Freq: Four times a day (QID) | ORAL | Status: DC | PRN

## 2019-06-07 MED ORDER — SCOPOLAMINE 1 MG/3DAYS TD PT72
MEDICATED_PATCH | TRANSDERMAL | Status: DC | PRN
  Administered 2019-06-07: 1 via TRANSDERMAL

## 2019-06-07 MED ORDER — DEXAMETHASONE SODIUM PHOSPHATE 4 MG/ML IJ SOLN
INTRAMUSCULAR | Status: DC | PRN
  Administered 2019-06-07: 4 mg via INTRAVENOUS

## 2019-06-07 MED ORDER — PROMETHAZINE HCL 25 MG/ML IJ SOLN: 6.2500 mg | INTRAMUSCULAR | Status: DC | PRN

## 2019-06-07 MED ORDER — SCOPOLAMINE 1 MG/3DAYS TD PT72
MEDICATED_PATCH | TRANSDERMAL | Status: AC
  Filled 2019-06-07: qty 1

## 2019-06-07 MED ORDER — LACTATED RINGERS IV SOLN
INTRAVENOUS | Status: DC | PRN
  Administered 2019-06-07 (×2): via INTRAVENOUS

## 2019-06-07 MED ORDER — GENTAMICIN SULFATE 40 MG/ML IJ SOLN
5.0000 mg/kg | INTRAVENOUS | Status: AC
  Administered 2019-06-07: 10:00:00 440 mg via INTRAVENOUS
  Filled 2019-06-07: qty 11

## 2019-06-07 MED ORDER — DEXAMETHASONE SODIUM PHOSPHATE 4 MG/ML IJ SOLN
INTRAMUSCULAR | Status: AC
  Filled 2019-06-07: qty 7

## 2019-06-07 MED ORDER — NALBUPHINE HCL 10 MG/ML IJ SOLN
5.0000 mg | Freq: Once | INTRAMUSCULAR | Status: DC | PRN
  Filled 2019-06-07: qty 0.5

## 2019-06-07 MED ORDER — PHENYLEPHRINE 40 MCG/ML (10ML) SYRINGE FOR IV PUSH (FOR BLOOD PRESSURE SUPPORT)
PREFILLED_SYRINGE | INTRAVENOUS | Status: AC
  Filled 2019-06-07: qty 10

## 2019-06-07 MED ORDER — BUPIVACAINE IN DEXTROSE 0.75-8.25 % IT SOLN
INTRATHECAL | Status: DC | PRN
  Administered 2019-06-07: 1.6 mL via INTRATHECAL

## 2019-06-07 MED ORDER — STERILE WATER FOR IRRIGATION IR SOLN
Status: DC | PRN
  Administered 2019-06-07: 1000 mL

## 2019-06-07 MED ORDER — ONDANSETRON HCL 4 MG/2ML IJ SOLN
INTRAMUSCULAR | Status: DC | PRN
  Administered 2019-06-07: 4 mg via INTRAVENOUS

## 2019-06-07 MED ORDER — METOCLOPRAMIDE HCL 5 MG/ML IJ SOLN
INTRAMUSCULAR | Status: AC
  Filled 2019-06-07: qty 2

## 2019-06-07 MED ORDER — LACTATED RINGERS IV SOLN
INTRAVENOUS | Status: DC
  Administered 2019-06-07 (×3): via INTRAVENOUS

## 2019-06-07 MED ORDER — COCONUT OIL OIL
1.0000 "application " | TOPICAL_OIL | Status: DC | PRN
  Administered 2019-06-08: 1 via TOPICAL

## 2019-06-07 MED ORDER — NALBUPHINE HCL 10 MG/ML IJ SOLN
5.0000 mg | INTRAMUSCULAR | Status: DC | PRN
  Filled 2019-06-07: qty 0.5

## 2019-06-07 MED ORDER — METOCLOPRAMIDE HCL 5 MG/ML IJ SOLN
INTRAMUSCULAR | Status: DC | PRN
  Administered 2019-06-07: 10 mg via INTRAVENOUS

## 2019-06-07 MED ORDER — MEPERIDINE HCL 25 MG/ML IJ SOLN: 6.2500 mg | INTRAMUSCULAR | Status: DC | PRN

## 2019-06-07 MED ORDER — SOD CITRATE-CITRIC ACID 500-334 MG/5ML PO SOLN
ORAL | Status: AC
  Filled 2019-06-07: qty 30

## 2019-06-07 MED ORDER — TETANUS-DIPHTH-ACELL PERTUSSIS 5-2.5-18.5 LF-MCG/0.5 IM SUSP: 0.5000 mL | Freq: Once | INTRAMUSCULAR | Status: DC

## 2019-06-07 MED ORDER — OXYCODONE HCL 5 MG PO TABS: 5.0000 mg | ORAL_TABLET | Freq: Once | ORAL | Status: DC | PRN

## 2019-06-07 MED ORDER — MENTHOL 3 MG MT LOZG: 1.0000 | LOZENGE | OROMUCOSAL | Status: DC | PRN

## 2019-06-07 MED ORDER — NALOXONE HCL 0.4 MG/ML IJ SOLN: 0.4000 mg | INTRAMUSCULAR | Status: DC | PRN

## 2019-06-07 MED ORDER — FENTANYL CITRATE (PF) 100 MCG/2ML IJ SOLN
INTRAMUSCULAR | Status: DC | PRN
  Administered 2019-06-07: 15 ug via INTRATHECAL

## 2019-06-07 MED ORDER — OXYCODONE HCL 5 MG PO TABS
5.0000 mg | ORAL_TABLET | ORAL | Status: DC | PRN
  Administered 2019-06-08 – 2019-06-09 (×4): 5 mg via ORAL
  Filled 2019-06-07 (×4): qty 1

## 2019-06-07 MED ORDER — SIMETHICONE 80 MG PO CHEW
80.0000 mg | CHEWABLE_TABLET | Freq: Three times a day (TID) | ORAL | Status: DC
  Administered 2019-06-07 – 2019-06-09 (×6): 80 mg via ORAL
  Filled 2019-06-07 (×6): qty 1

## 2019-06-07 MED ORDER — ONDANSETRON HCL 4 MG/2ML IJ SOLN: 4.0000 mg | Freq: Three times a day (TID) | INTRAMUSCULAR | Status: DC | PRN

## 2019-06-07 MED ORDER — CLINDAMYCIN PHOSPHATE 900 MG/50ML IV SOLN
INTRAVENOUS | Status: AC
  Filled 2019-06-07: qty 50

## 2019-06-07 MED ORDER — SODIUM CHLORIDE 0.9% FLUSH: 3.0000 mL | INTRAVENOUS | Status: DC | PRN

## 2019-06-07 MED ORDER — PHENYLEPHRINE HCL-NACL 20-0.9 MG/250ML-% IV SOLN
INTRAVENOUS | Status: AC
  Filled 2019-06-07: qty 250

## 2019-06-07 MED ORDER — ACETAMINOPHEN 500 MG PO TABS
ORAL_TABLET | ORAL | Status: AC
  Filled 2019-06-07: qty 2

## 2019-06-07 MED ORDER — HYDROMORPHONE HCL 1 MG/ML IJ SOLN
INTRAMUSCULAR | Status: AC
  Filled 2019-06-07: qty 1

## 2019-06-07 MED ORDER — SODIUM CHLORIDE 0.9 % IR SOLN
Status: DC | PRN
  Administered 2019-06-07: 1

## 2019-06-07 MED ORDER — SENNOSIDES-DOCUSATE SODIUM 8.6-50 MG PO TABS
2.0000 | ORAL_TABLET | ORAL | Status: DC
  Administered 2019-06-07 – 2019-06-08 (×2): 2 via ORAL
  Filled 2019-06-07 (×2): qty 2

## 2019-06-07 MED ORDER — SODIUM CHLORIDE 0.9 % IV SOLN
INTRAVENOUS | Status: DC | PRN
  Administered 2019-06-07: 11:00:00 via INTRAVENOUS

## 2019-06-07 MED ORDER — CLINDAMYCIN PHOSPHATE 900 MG/50ML IV SOLN
900.0000 mg | INTRAVENOUS | Status: AC
  Administered 2019-06-07: 10:00:00 900 mg via INTRAVENOUS

## 2019-06-07 MED ORDER — FAMOTIDINE 20 MG PO TABS
20.0000 mg | ORAL_TABLET | Freq: Once | ORAL | Status: AC
  Administered 2019-06-07: 20 mg via ORAL

## 2019-06-07 MED ORDER — SODIUM CHLORIDE 0.9 % IV SOLN
INTRAVENOUS | Status: DC | PRN
  Administered 2019-06-07: 60 ug/min via INTRAVENOUS

## 2019-06-07 MED ORDER — NALOXONE HCL 4 MG/10ML IJ SOLN
1.0000 ug/kg/h | INTRAVENOUS | Status: DC | PRN
  Filled 2019-06-07: qty 5

## 2019-06-07 MED ORDER — FENTANYL CITRATE (PF) 100 MCG/2ML IJ SOLN
INTRAMUSCULAR | Status: AC
  Filled 2019-06-07: qty 2

## 2019-06-07 MED ORDER — OXYTOCIN 40 UNITS IN NORMAL SALINE INFUSION - SIMPLE MED: 2.5000 [IU]/h | INTRAVENOUS | Status: AC

## 2019-06-07 MED ORDER — SIMETHICONE 80 MG PO CHEW: 80.0000 mg | CHEWABLE_TABLET | ORAL | Status: DC | PRN

## 2019-06-07 MED ORDER — KETOROLAC TROMETHAMINE 30 MG/ML IJ SOLN
INTRAMUSCULAR | Status: AC
  Filled 2019-06-07: qty 1

## 2019-06-07 MED ORDER — SERTRALINE HCL 25 MG PO TABS
25.0000 mg | ORAL_TABLET | Freq: Every day | ORAL | Status: DC
  Administered 2019-06-08 – 2019-06-09 (×2): 25 mg via ORAL
  Filled 2019-06-07 (×2): qty 1

## 2019-06-07 SURGICAL SUPPLY — 38 items
BENZOIN TINCTURE PRP APPL 2/3 (GAUZE/BANDAGES/DRESSINGS) IMPLANT
CHLORAPREP W/TINT 26ML (MISCELLANEOUS) ×3 IMPLANT
CLAMP CORD UMBIL (MISCELLANEOUS) IMPLANT
CLOSURE WOUND 1/2 X4 (GAUZE/BANDAGES/DRESSINGS)
CLOTH BEACON ORANGE TIMEOUT ST (SAFETY) ×3 IMPLANT
DRAPE C SECTION CLR SCREEN (DRAPES) ×3 IMPLANT
DRSG OPSITE POSTOP 4X10 (GAUZE/BANDAGES/DRESSINGS) ×3 IMPLANT
ELECT REM PT RETURN 9FT ADLT (ELECTROSURGICAL) ×3
ELECTRODE REM PT RTRN 9FT ADLT (ELECTROSURGICAL) ×1 IMPLANT
EXTRACTOR VACUUM KIWI (MISCELLANEOUS) IMPLANT
GLOVE BIO SURGEON STRL SZ 6.5 (GLOVE) ×2 IMPLANT
GLOVE BIO SURGEONS STRL SZ 6.5 (GLOVE) ×1
GLOVE BIOGEL PI IND STRL 7.0 (GLOVE) ×2 IMPLANT
GLOVE BIOGEL PI INDICATOR 7.0 (GLOVE) ×4
GOWN STRL REUS W/TWL LRG LVL3 (GOWN DISPOSABLE) ×6 IMPLANT
KIT ABG SYR 3ML LUER SLIP (SYRINGE) IMPLANT
NEEDLE HYPO 25X5/8 SAFETYGLIDE (NEEDLE) IMPLANT
NS IRRIG 1000ML POUR BTL (IV SOLUTION) ×3 IMPLANT
PACK C SECTION WH (CUSTOM PROCEDURE TRAY) ×3 IMPLANT
PAD OB MATERNITY 4.3X12.25 (PERSONAL CARE ITEMS) ×3 IMPLANT
RETAINER VISCERAL (MISCELLANEOUS) ×3 IMPLANT
RETRACTOR WND ALEXIS 25 LRG (MISCELLANEOUS) ×1 IMPLANT
RTRCTR C-SECT PINK 25CM LRG (MISCELLANEOUS) IMPLANT
RTRCTR WOUND ALEXIS 25CM LRG (MISCELLANEOUS) ×3
STRIP CLOSURE SKIN 1/2X4 (GAUZE/BANDAGES/DRESSINGS) IMPLANT
SUT CHROMIC 1 CTX 36 (SUTURE) ×6 IMPLANT
SUT PLAIN 0 NONE (SUTURE) IMPLANT
SUT PLAIN 2 0 XLH (SUTURE) ×3 IMPLANT
SUT VIC AB 0 CT1 27 (SUTURE) ×4
SUT VIC AB 0 CT1 27XBRD ANBCTR (SUTURE) ×2 IMPLANT
SUT VIC AB 2-0 CT1 27 (SUTURE) ×2
SUT VIC AB 2-0 CT1 TAPERPNT 27 (SUTURE) ×1 IMPLANT
SUT VIC AB 3-0 CT1 27 (SUTURE)
SUT VIC AB 3-0 CT1 TAPERPNT 27 (SUTURE) IMPLANT
SUT VIC AB 4-0 KS 27 (SUTURE) ×3 IMPLANT
TOWEL OR 17X24 6PK STRL BLUE (TOWEL DISPOSABLE) ×3 IMPLANT
TRAY FOLEY W/BAG SLVR 14FR LF (SET/KITS/TRAYS/PACK) ×3 IMPLANT
WATER STERILE IRR 1000ML POUR (IV SOLUTION) ×3 IMPLANT

## 2019-06-07 NOTE — Anesthesia Preprocedure Evaluation (Addendum)
Anesthesia Evaluation  Patient identified by MRN, date of birth, ID band Patient awake    Reviewed: Allergy & Precautions, NPO status , Patient's Chart, lab work & pertinent test results  Airway Mallampati: I  TM Distance: >3 FB Neck ROM: Full    Dental no notable dental hx.    Pulmonary asthma , former smoker,    Pulmonary exam normal breath sounds clear to auscultation       Cardiovascular Normal cardiovascular exam Rhythm:Regular Rate:Normal     Neuro/Psych PSYCHIATRIC DISORDERS Anxiety Depression negative neurological ROS     GI/Hepatic negative GI ROS, Neg liver ROS,   Endo/Other  Morbid obesityPCOS (polycystic ovarian syndrome)  Renal/GU negative Renal ROS     Musculoskeletal negative musculoskeletal ROS (+)   Abdominal (+) + obese,   Peds  Hematology  (+) anemia ,   Anesthesia Other Findings repeat c-section  Reproductive/Obstetrics                            Anesthesia Physical Anesthesia Plan  ASA: III  Anesthesia Plan: Spinal   Post-op Pain Management:    Induction:   PONV Risk Score and Plan: 2 and Ondansetron, Dexamethasone and Treatment may vary due to age or medical condition  Airway Management Planned: Natural Airway  Additional Equipment:   Intra-op Plan:   Post-operative Plan:   Informed Consent: I have reviewed the patients History and Physical, chart, labs and discussed the procedure including the risks, benefits and alternatives for the proposed anesthesia with the patient or authorized representative who has indicated his/her understanding and acceptance.       Plan Discussed with: CRNA  Anesthesia Plan Comments:        Anesthesia Quick Evaluation

## 2019-06-07 NOTE — Anesthesia Procedure Notes (Signed)
Spinal  Patient location during procedure: OR Start time: 06/07/2019 9:50 AM End time: 06/07/2019 10:00 AM Staffing Anesthesiologist: Murvin Natal, MD Performed: anesthesiologist  Preanesthetic Checklist Completed: patient identified, surgical consent, pre-op evaluation, timeout performed, IV checked, risks and benefits discussed and monitors and equipment checked Spinal Block Patient position: sitting Prep: DuraPrep Patient monitoring: cardiac monitor, continuous pulse ox and blood pressure Approach: midline Location: L3-4 Injection technique: single-shot Needle Needle type: Pencan  Needle gauge: 24 G Needle length: 9 cm Assessment Sensory level: T10 Additional Notes Functioning IV was confirmed and monitors were applied. Sterile prep and drape, including hand hygiene and sterile gloves were used. The patient was positioned and the spine was prepped. The skin was anesthetized with lidocaine.  Free flow of clear CSF was obtained on the third attempt prior to injecting local anesthetic into the CSF.  The spinal needle aspirated freely following injection.  The needle was carefully withdrawn.  The patient tolerated the procedure well.

## 2019-06-07 NOTE — Op Note (Signed)
Operative Note    Preoperative Diagnosis: IUP at term 2. Prior cesarean section 3. Unfavorable cervix   Postoperative Diagnosis: Same   Procedure: Repeat low transverse cesarean section with two layered closure   Surgeon: Mickle Mallory DO  Anesthesia: Spinal  Fluids: LR 3843ml EBL: 349ml UOP: 6ml   Findings: Viable female infant in vertex, nuchal x 2, small uterine window. Grossly normal uterus, tubes and ovaries. Apgars 9,9   Specimen: Placenta - donated   Procedure Note Consent verified pre-op. All questions answered   Patient was taken to the operating room where spinal anesthesia was administered. She was prepped and draped in the normal sterile fashion and placed in the dorsal supine position with a leftward tilt. An appropriate time out was performed. A Pfannenstiel skin incision was then made through the previous incision with the scalpel and carried through to the underlying layer of fascia by sharp dissection and Bovie cautery. The fascia was nicked in the midline and the incision was extended laterally with Mayo scissors. The superior and inferior aspects of the incision were grasped Coker clamps and dissected off the underlying rectus muscles.Rectus muscles were separated in the midline  and the peritoneal cavity entered bluntly. The peritoneal incision was then extended both superiorly and inferiorly with careful attention to avoid both bowel and bladder. The Alexis self-retaining wound retractor was then placed and the lower uterine segment exposed. The bladder flap was developed with Metzenbaum scissors and pushed away from the lower uterine segment. The lower uterine segment was then incised in a transverse fashion and the cavity itself entered bluntly. The incision was extended bluntly. The infant's head was then lifted and delivered from the incision without difficulty. Vigorous spontaneous cry was noted during delivery.  The remainder of the infant delivered and the nose and  mouth bulb suctioned. The cord was clamped and cut after a minute delay. The infant was handed off to the waiting pediatricians. The placenta was then spontaneously expressed from the uterus and the uterus cleared of all clots and debris with moist lap sponge. The uterine incision was then repaired in 2 layers the first layer was a running locked layer 1-0 chromic and the second an imbricating layer of the same suture. The tubes and ovaries were inspected and the gutters cleared of all clots and debris. The uterine incision was inspected and found to be hemostatic. All instruments and sponges were then removed from the abdomen. The peritoneum was then reapproximated in a running fashion with sutures of 2-0 Vicryl. The rectus muscles were incorporated. The fascia was then closed with 0 Vicryl in a running fashion. Subcutaneous tissue was reapproximated with 3-0 plain in a running fashion. The skin was closed with a subcuticular stitch of 4-0 Vicryl on a Keith needle and then reinforced with benzoin and Steri-Strips. At the conclusion of the procedure all instruments and sponge counts were correct. Patient was taken to the recovery room in good condition with her baby accompanying her skin to skin.

## 2019-06-07 NOTE — Interval H&P Note (Signed)
History and Physical Interval Note: Pt doing well. Answered questions about skin to skin time. Consent for procedure reviewed.  To OR when ready  06/07/2019 9:33 AM  Katie Dorsey  has presented today for surgery, with the diagnosis of repeat c-section.  The various methods of treatment have been discussed with the patient and family. After consideration of risks, benefits and other options for treatment, the patient has consented to  Procedure(s) with comments: CESAREAN SECTION (N/A) - request RNFA as a surgical intervention.  The patient's history has been reviewed, patient examined, no change in status, stable for surgery.  I have reviewed the patient's chart and labs.  Questions were answered to the patient's satisfaction.     Isaiah Serge

## 2019-06-07 NOTE — Transfer of Care (Signed)
Immediate Anesthesia Transfer of Care Note  Patient: Katie Dorsey  Procedure(s) Performed: CESAREAN SECTION (N/A Abdomen)  Patient Location: PACU  Anesthesia Type:Spinal  Level of Consciousness: awake, alert  and oriented  Airway & Oxygen Therapy: Patient Spontanous Breathing  Post-op Assessment: Report given to RN and Post -op Vital signs reviewed and stable  Post vital signs: Reviewed and stable  Last Vitals:  Vitals Value Taken Time  BP 117/51 06/07/19 1115  Temp    Pulse 89 06/07/19 1116  Resp 22 06/07/19 1116  SpO2 99 % 06/07/19 1116  Vitals shown include unvalidated device data.  Last Pain:  Vitals:   06/07/19 0742  TempSrc: Oral         Complications: No apparent anesthesia complications

## 2019-06-08 ENCOUNTER — Other Ambulatory Visit: Payer: Self-pay

## 2019-06-08 LAB — CBC
HCT: 29.7 % — ABNORMAL LOW (ref 36.0–46.0)
Hemoglobin: 9.5 g/dL — ABNORMAL LOW (ref 12.0–15.0)
MCH: 28.2 pg (ref 26.0–34.0)
MCHC: 32 g/dL (ref 30.0–36.0)
MCV: 88.1 fL (ref 80.0–100.0)
Platelets: 282 10*3/uL (ref 150–400)
RBC: 3.37 MIL/uL — ABNORMAL LOW (ref 3.87–5.11)
RDW: 15.3 % (ref 11.5–15.5)
WBC: 8.9 10*3/uL (ref 4.0–10.5)
nRBC: 0 % (ref 0.0–0.2)

## 2019-06-08 LAB — BIRTH TISSUE RECOVERY COLLECTION (PLACENTA DONATION)

## 2019-06-08 NOTE — Progress Notes (Signed)
CSW received consult for hx of marijuana use.  Referral was screened out due to the following: ~MOB had no documented substance use after initial prenatal visit/+UPT. ~MOB had no positive drug screens after initial prenatal visit/+UPT. ~Baby's UDS is negative.  Please consult CSW if current concerns arise or by MOB's request.  CSW will monitor CDS results and make report to Child Protective Services if warranted.  MOB was referred for history of depression/anxiety. * Referral screened out by Clinical Social Worker because none of the following criteria appear to apply: ~ History of anxiety/depression during this pregnancy, or of post-partum depression following prior delivery. ~ Diagnosis of anxiety and/or depression within last 3 years OR * MOB's symptoms currently being treated with medication and/or therapy. Per chart review, "mixed anxiety/depression stable on 25 mg Zoloft".   Please contact the Clinical Social Worker if needs arise, by MOB request, or if MOB scores greater than 9/yes to question 10 on Edinburgh Postpartum Depression Screen.  Abdulhamid Olgin, LCSW Clinical Social Worker [facility] Cell#: [phone]  

## 2019-06-08 NOTE — Anesthesia Postprocedure Evaluation (Signed)
Anesthesia Post Note  Patient: Katie Dorsey  Procedure(s) Performed: CESAREAN SECTION (N/A Abdomen)     Patient location during evaluation: PACU Anesthesia Type: Spinal Level of consciousness: oriented and awake and alert Pain management: pain level controlled Vital Signs Assessment: post-procedure vital signs reviewed and stable Respiratory status: spontaneous breathing, respiratory function stable and patient connected to nasal cannula oxygen Cardiovascular status: blood pressure returned to baseline and stable Postop Assessment: no headache, no backache, no apparent nausea or vomiting and spinal receding Anesthetic complications: no    Last Vitals:  Vitals:   06/07/19 2335 06/08/19 0341  BP: 118/66 125/74  Pulse: 76 79  Resp: 18 18  Temp: 36.6 C 36.8 C  SpO2: 99% 100%    Last Pain:  Vitals:   06/08/19 0538  TempSrc:   PainSc: 2    Pain Goal:                   Murvin Natal

## 2019-06-08 NOTE — Progress Notes (Signed)
Subjective: Postpartum Day 1: Cesarean Delivery Patient reports tolerating PO, + flatus and no problems voiding.  Some cramping with breastfeeding as expected. Baby cluster fed last night so tired today but bonding well. No HA/CP/SOB or fever  Objective: Vital signs in last 24 hours: Temp:  [97.9 F (36.6 C)-98.8 F (37.1 C)] 98 F (36.7 C) (09/19 0810) Pulse Rate:  [71-90] 72 (09/19 0810) Resp:  [15-20] 17 (09/19 0810) BP: (114-130)/(51-79) 114/65 (09/19 0810) SpO2:  [98 %-100 %] 100 % (09/19 0341)  Physical Exam:  General: alert, cooperative and no distress Lochia: appropriate Uterine Fundus: firm Incision: no significant drainage DVT Evaluation: No evidence of DVT seen on physical exam.  Recent Labs    06/08/19 0715  HGB 9.5*  HCT 29.7*    Assessment/Plan: Status post Cesarean section. Doing well postoperatively.  Continue current care Considering discharge to home tomorrow Discussed circumcision.  Isaiah Serge 06/08/2019, 10:22 AM

## 2019-06-08 NOTE — Lactation Note (Signed)
This note was copied from a baby's chart. Lactation Consultation Note  Patient Name: Katie Dorsey NOMVE'H Date: 06/08/2019 Reason for consult: Initial assessment;Term Baby is 34 hours old.  Mom reports that baby is latching better.  Initially baby latched incorrectly and mom bruised.  Discussed importance of a wide latch with good depth.  Reviewed hand expression and large drop of milk expressed.  Baby just returned from circumcision and is not showing interest in feeding.  Mom will watch for feeding cues and feed with cues.  Encouraged to call for assist prn.  Breastfeeding consultation services information given and reviewed.  Maternal Data Has patient been taught Hand Expression?: Yes Does the patient have breastfeeding experience prior to this delivery?: Yes  Feeding    LATCH Score                   Interventions    Lactation Tools Discussed/Used     Consult Status Consult Status: Follow-up Date: 06/09/19 Follow-up type: In-patient    Ave Filter 06/08/2019, 11:13 AM

## 2019-06-09 ENCOUNTER — Encounter (HOSPITAL_COMMUNITY): Payer: Self-pay | Admitting: *Deleted

## 2019-06-09 MED ORDER — OXYCODONE HCL 5 MG PO TABS: 5.0000 mg | ORAL_TABLET | ORAL | 0 refills | Status: AC | PRN

## 2019-06-09 MED ORDER — IBUPROFEN 800 MG PO TABS: 800.0000 mg | ORAL_TABLET | Freq: Three times a day (TID) | ORAL | 1 refills | Status: AC | PRN

## 2019-06-09 NOTE — Discharge Instructions (Signed)
Call office with any concerns [phone] 

## 2019-06-09 NOTE — Progress Notes (Signed)
Subjective: Postpartum Day 2: Cesarean Delivery Patient reports tolerating PO, + flatus and no problems voiding. Pain well controlled. Ambulating well. Lochia scant. Bonding well with baby breastfeeding. Requests early discharge today.    Objective: Vital signs in last 24 hours: Temp:  [98 F (36.7 C)-98.2 F (36.8 C)] 98 F (36.7 C) (09/20 0624) Pulse Rate:  [72-74] 72 (09/20 0624) Resp:  [16-18] 16 (09/20 0624) BP: (114-124)/(57-73) 124/73 (09/20 0624) SpO2:  [100 %] 100 % (09/20 6381)  Physical Exam:  General: alert, cooperative and no distress Lochia: appropriate Uterine Fundus: firm Incision: no significant drainage DVT Evaluation: No evidence of DVT seen on physical exam. No significant calf/ankle edema.  Recent Labs    06/08/19 0715  HGB 9.5*  HCT 29.7*    Assessment/Plan: Status post Cesarean section. Doing well postoperatively.  Discharge home with standard precautions and return to clinic in 6 weeks.  Katie Dorsey 06/09/2019, 10:52 AM

## 2019-06-09 NOTE — Discharge Summary (Signed)
OB Discharge Summary     Patient Name: Katie Dorsey DOB: Dec 09, 1985 MRN: [id number]  Date of admission: 06/07/2019 Delivering MD: Katie Dorsey   Date of discharge: 06/09/2019  Admitting diagnosis: repeat c-section Intrauterine pregnancy: [redacted]w[redacted]d     Secondary diagnosis:  Active Problems:   Status post repeat low transverse cesarean section   Term pregnancy   Postpartum care following cesarean delivery  Additional problems: none     Discharge diagnosis: Term Pregnancy Delivered                                                                                                Post partum procedures:none  Augmentation: n/a  Complications: None  Hospital course:  Sceduled C/S   33 y.o. yo G2P1001 at [redacted]w[redacted]d was admitted to the hospital 06/07/2019 for scheduled cesarean section with the following indication:Elective Repeat.  Membrane Rupture Time/Date: 10:26 AM ,06/07/2019   Patient delivered a Viable infant.06/07/2019  Details of operation can be found in separate operative note.  Pateint had an uncomplicated postpartum course.  She is ambulating, tolerating a regular diet, passing flatus, and urinating well. Patient is discharged home in stable condition on  06/09/19         Physical exam  Vitals:   06/08/19 0810 06/08/19 1404 06/08/19 2314 06/09/19 0624  BP: 114/65 120/68 (!) 114/57 124/73  Pulse: 72 74 72 72  Resp: 17 18 16 16   Temp: 98 F (36.7 C) 98.2 F (36.8 C)  98 F (36.7 C)  TempSrc: Axillary Oral  Oral  SpO2:    100%  Weight:      Height:       General: alert, cooperative and no distress Lochia: appropriate Uterine Fundus: firm Incision: Dressing is clean, dry, and intact DVT Evaluation: No evidence of DVT seen on physical exam. Labs: Lab Results  Component Value Date   WBC 8.9 06/08/2019   HGB 9.5 (L) 06/08/2019   HCT 29.7 (L) 06/08/2019   MCV 88.1 06/08/2019   PLT 282 06/08/2019   CMP Latest Ref Rng & Units 06/07/2019  Glucose 70 - 99 mg/dL -   BUN 6 - 23 mg/dL -  Creatinine 0.50 - 1.10 mg/dL -  Sodium 135 - 145 mmol/L -  Potassium 3.5 - 5.1 mmol/L -  Chloride 96 - 112 mEq/L -  CO2 19 - 32 mmol/L -  Calcium 8.4 - 10.5 mg/dL -  Total Protein 6.5 - 8.1 g/dL 5.9(L)  Total Bilirubin 0.3 - 1.2 mg/dL 0.3  Alkaline Phos 38 - 126 U/L 180(H)  AST 15 - 41 U/L 14(L)  ALT 0 - 44 U/L 13    Discharge instruction: per After Visit Summary and "Baby and Me Booklet".  After visit meds:  Allergies as of 06/09/2019      Reactions   Mango Flavor Anaphylaxis   Fruit   Penicillins Hives, Itching, Rash   Did it involve swelling of the face/tongue/throat, SOB, or low BP? No Did it involve sudden or severe rash/hives, skin peeling, or any reaction on the inside of your mouth or nose? Yes Did you need  to seek medical attention at a hospital or doctor's office? Yes When did it last happen?age 53 or 8 If all above answers are "NO", may proceed with cephalosporin use.   Mucinex [guaifenesin Er] Hives   Keflex [cephalexin] Hives, Itching, Rash      Medication List    TAKE these medications   ferrous sulfate 325 (65 FE) MG EC tablet Take 325 mg by mouth every other day. In the morning   ibuprofen 800 MG tablet Commonly known as: ADVIL Take 1 tablet (800 mg total) by mouth every 8 (eight) hours as needed for cramping.   oxyCODONE 5 MG immediate release tablet Commonly known as: Oxy IR/ROXICODONE Take 1-2 tablets (5-10 mg total) by mouth every 4 (four) hours as needed for up to 7 days for moderate pain or severe pain.   prenatal vitamin w/FE, FA 27-1 MG Tabs tablet Take 1 tablet by mouth daily.   sertraline 50 MG tablet Commonly known as: ZOLOFT Take 25 mg by mouth daily.       Diet: routine diet  Activity: Advance as tolerated. Pelvic rest for 6 weeks.   Outpatient follow up: 2 weeks incision check and 6 week postpartum visit Follow up Appt:No future appointments. Follow up Visit:No follow-ups on file.  Postpartum  contraception: Not Discussed  Newborn Data: Live born female  Birth Weight: 9 lb 1 oz (4110 g) APGAR: 9, 9  Newborn Delivery   Birth date/time: 06/07/2019 10:29:00 Delivery type: C-Section, Low Transverse Trial of labor: No C-section categorization: Repeat      Baby Feeding: Breast Disposition:home with mother   06/09/2019 Katie Musterecilia W Earnie Bechard, DO

## 2019-06-09 NOTE — Lactation Note (Signed)
This note was copied from a baby's chart. Lactation Consultation Note  Patient Name: Katie Dorsey EXBMW'[id number] Date: 06/09/2019 Reason for consult: Follow-up assessment;Term;Infant weight loss  73 hours old FT female who is being exclusively BF by his mother, she's a P2. Mom and baby are going home today, baby is at 9% weight loss but doing a lot better with BF. Mom had sore nipples yesterday due to a poor latch, she's been using coconut oil and comfort gels (not together). She has Hx of THC use but the UDS was (-).  Mom was holding baby when entering the room, she said he's been cluster feeding. Mom getting ready to switch sides, assisted with latch and baby would latch easily, mom now knows what a deep latch looks like, a few audible swallows noted with and without breast compressions over this 14 minute feeding.   Reviewed discharge instructions, prevention and treatment for sore nipples and advised mom to use her colostrum as the # 1 remedy for it. Reviewed cluster feeding and engorgement prevention and treatment as well and red flags on when to call baby's pediatrician. Mom reported all questions and concerns were answered, she's aware of [place] OP services and will contact PRN.  Maternal Data    Feeding Feeding Type: Breast Fed  LATCH Score Latch: Grasps breast easily, tongue down, lips flanged, rhythmical sucking.  Audible Swallowing: A few with stimulation  Type of Nipple: Everted at rest and after stimulation  Comfort (Breast/Nipple): Soft / non-tender(nipples are already healing)  Hold (Positioning): No assistance needed to correctly position infant at breast.  LATCH Score: 9  Interventions Interventions: Breast feeding basics reviewed;Assisted with latch;Skin to skin;Breast massage;Hand express;Breast compression;Support pillows;Adjust position  Lactation Tools Discussed/Used     Consult Status Consult Status: Complete Date: 06/09/19 Follow-up type:  In-patient    Raylyn Speckman Francene Boyers 06/09/2019, 1:44 PM

## 2019-09-23 ENCOUNTER — Emergency Department (HOSPITAL_COMMUNITY)
Admission: EM | Admit: 2019-09-23 | Discharge: 2019-09-24 | Disposition: A | Discharge status: Home / Self Care | Payer: Self-pay | Attending: Emergency Medicine | Admitting: Emergency Medicine

## 2019-09-23 ENCOUNTER — Emergency Department (HOSPITAL_COMMUNITY): Payer: Self-pay

## 2019-09-23 ENCOUNTER — Encounter (HOSPITAL_COMMUNITY): Payer: Self-pay | Admitting: Emergency Medicine

## 2019-09-23 ENCOUNTER — Other Ambulatory Visit: Payer: Self-pay

## 2019-09-23 DIAGNOSIS — R109 Unspecified abdominal pain: Secondary | ICD-10-CM | POA: Insufficient documentation

## 2019-09-23 DIAGNOSIS — Z5321 Procedure and treatment not carried out due to patient leaving prior to being seen by health care provider: Secondary | ICD-10-CM | POA: Insufficient documentation

## 2019-09-23 LAB — URINALYSIS, ROUTINE W REFLEX MICROSCOPIC
Bilirubin Urine: NEGATIVE
Glucose, UA: NEGATIVE mg/dL
Ketones, ur: NEGATIVE mg/dL
Nitrite: NEGATIVE
Protein, ur: 30 mg/dL — AB
RBC / HPF: >50 RBC/hpf — ABNORMAL HIGH (ref 0–5)
Specific Gravity, Urine: 1.019 (ref 1.005–1.030)
pH: 5 (ref 5.0–8.0)

## 2019-09-23 LAB — COMPREHENSIVE METABOLIC PANEL
ALT: 25 U/L (ref 0–44)
AST: 20 U/L (ref 15–41)
Albumin: 4.2 g/dL (ref 3.5–5.0)
Alkaline Phosphatase: 109 U/L (ref 38–126)
Anion gap: 9 (ref 5–15)
BUN: 13 mg/dL (ref 6–20)
CO2: 27 mmol/L (ref 22–32)
Calcium: 9.9 mg/dL (ref 8.9–10.3)
Chloride: 105 mmol/L (ref 98–111)
Creatinine, Ser: 0.75 mg/dL (ref 0.44–1.00)
GFR calc Af Amer: >60 mL/min (ref >60)
GFR calc non Af Amer: >60 mL/min (ref >60)
Glucose, Bld: 109 mg/dL — ABNORMAL HIGH (ref 70–99)
Potassium: 4.4 mmol/L (ref 3.5–5.1)
Sodium: 141 mmol/L (ref 135–145)
Total Bilirubin: 0.5 mg/dL (ref 0.3–1.2)
Total Protein: 7.6 g/dL (ref 6.5–8.1)

## 2019-09-23 LAB — CBC
HCT: 39.8 % (ref 36.0–46.0)
Hemoglobin: 12.9 g/dL (ref 12.0–15.0)
MCH: 28 pg (ref 26.0–34.0)
MCHC: 32.4 g/dL (ref 30.0–36.0)
MCV: 86.5 fL (ref 80.0–100.0)
Platelets: 413 10*3/uL — ABNORMAL HIGH (ref 150–400)
RBC: 4.6 MIL/uL (ref 3.87–5.11)
RDW: 14.8 % (ref 11.5–15.5)
WBC: 10.9 10*3/uL — ABNORMAL HIGH (ref 4.0–10.5)
nRBC: 0 % (ref 0.0–0.2)

## 2019-09-23 LAB — I-STAT BETA HCG BLOOD, ED (MC, WL, AP ONLY): I-stat hCG, quantitative: <5 m[IU]/mL (ref <5)

## 2019-09-23 LAB — LIPASE, BLOOD: Lipase: 26 U/L (ref 11–51)

## 2019-09-23 MED ORDER — SODIUM CHLORIDE 0.9% FLUSH: 3.0000 mL | Freq: Once | INTRAVENOUS | Status: DC

## 2019-09-23 MED ORDER — OXYCODONE-ACETAMINOPHEN 5-325 MG PO TABS
1.0000 | ORAL_TABLET | ORAL | Status: DC | PRN
  Administered 2019-09-23: 16:00:00 1 via ORAL
  Filled 2019-09-23: qty 1

## 2019-09-23 NOTE — ED Notes (Signed)
Verbal order CT renal study, and pain medicine per Dr. Dalene Seltzer.

## 2019-09-23 NOTE — ED Triage Notes (Signed)
Pt c/o LLQ pain that started this morning, reports pain in her back yesterday. Urine is darker than usual. Hx ovarian cysts.

## 2019-09-23 NOTE — ED Notes (Signed)
Pt reports intense pain reoccurring despite having received medication from a nurse while she is seated in the waiting room.

## 2019-09-23 NOTE — ED Provider Notes (Signed)
MSE was initiated and I personally evaluated the patient and placed orders (if any) at  3:45 PM on September 23, 2019.  The patient appears stable so that the remainder of the MSE may be completed by another provider.  Presents with severe flank and suprapubic/left sided abdominal pain. Pain began in flank with dark urine then became stabbing in lower abdomen today.  DDx includes ectopic pregnancy, nephrolithiasis, ovarian torsion, UTI, cyst rupture, diverticulitis.  Given pain starting in flank initially, dark urine, ordered CT stone study in addition to labs. If CT negative would consider pelvic US.   Alvira Monday, MD 09/23/19 1549

## 2019-09-24 NOTE — ED Notes (Signed)
Pt leaving AMA. Pt stated she will return if symptoms worsen.

## 2019-12-07 ENCOUNTER — Emergency Department (HOSPITAL_BASED_OUTPATIENT_CLINIC_OR_DEPARTMENT_OTHER)
Admission: EM | Admit: 2019-12-07 | Discharge: 2019-12-07 | Disposition: A | Discharge status: Home / Self Care | Payer: BC Managed Care – PPO | Attending: Emergency Medicine | Admitting: Emergency Medicine

## 2019-12-07 ENCOUNTER — Encounter (HOSPITAL_BASED_OUTPATIENT_CLINIC_OR_DEPARTMENT_OTHER): Payer: Self-pay

## 2019-12-07 ENCOUNTER — Emergency Department (HOSPITAL_BASED_OUTPATIENT_CLINIC_OR_DEPARTMENT_OTHER): Payer: BC Managed Care – PPO

## 2019-12-07 ENCOUNTER — Other Ambulatory Visit: Payer: Self-pay

## 2019-12-07 DIAGNOSIS — M79662 Pain in left lower leg: Secondary | ICD-10-CM | POA: Insufficient documentation

## 2019-12-07 DIAGNOSIS — Y999 Unspecified external cause status: Secondary | ICD-10-CM | POA: Insufficient documentation

## 2019-12-07 DIAGNOSIS — Y9389 Activity, other specified: Secondary | ICD-10-CM | POA: Insufficient documentation

## 2019-12-07 DIAGNOSIS — M79605 Pain in left leg: Secondary | ICD-10-CM

## 2019-12-07 DIAGNOSIS — Z87891 Personal history of nicotine dependence: Secondary | ICD-10-CM | POA: Diagnosis not present

## 2019-12-07 DIAGNOSIS — Z79899 Other long term (current) drug therapy: Secondary | ICD-10-CM | POA: Insufficient documentation

## 2019-12-07 DIAGNOSIS — S8992XA Unspecified injury of left lower leg, initial encounter: Secondary | ICD-10-CM | POA: Diagnosis present

## 2019-12-07 DIAGNOSIS — X509XXA Other and unspecified overexertion or strenuous movements or postures, initial encounter: Secondary | ICD-10-CM | POA: Diagnosis not present

## 2019-12-07 DIAGNOSIS — S86112A Strain of other muscle(s) and tendon(s) of posterior muscle group at lower leg level, left leg, initial encounter: Secondary | ICD-10-CM | POA: Diagnosis not present

## 2019-12-07 DIAGNOSIS — Y9289 Other specified places as the place of occurrence of the external cause: Secondary | ICD-10-CM | POA: Insufficient documentation

## 2019-12-07 LAB — CBC WITH DIFFERENTIAL/PLATELET
Abs Immature Granulocytes: 0.04 10*3/uL (ref 0.00–0.07)
Basophils Absolute: 0.1 10*3/uL (ref 0.0–0.1)
Basophils Relative: 1 %
Eosinophils Absolute: 0.3 10*3/uL (ref 0.0–0.5)
Eosinophils Relative: 3 %
HCT: 37.6 % (ref 36.0–46.0)
Hemoglobin: 12.6 g/dL (ref 12.0–15.0)
Immature Granulocytes: 0 %
Lymphocytes Relative: 26 %
Lymphs Abs: 2.6 10*3/uL (ref 0.7–4.0)
MCH: 29.5 pg (ref 26.0–34.0)
MCHC: 33.5 g/dL (ref 30.0–36.0)
MCV: 88.1 fL (ref 80.0–100.0)
Monocytes Absolute: 0.5 10*3/uL (ref 0.1–1.0)
Monocytes Relative: 5 %
Neutro Abs: 6.4 10*3/uL (ref 1.7–7.7)
Neutrophils Relative %: 65 %
Platelets: 336 10*3/uL (ref 150–400)
RBC: 4.27 MIL/uL (ref 3.87–5.11)
RDW: 13.4 % (ref 11.5–15.5)
WBC: 9.9 10*3/uL (ref 4.0–10.5)
nRBC: 0 % (ref 0.0–0.2)

## 2019-12-07 LAB — BASIC METABOLIC PANEL
Anion gap: 11 (ref 5–15)
BUN: 15 mg/dL (ref 6–20)
CO2: 23 mmol/L (ref 22–32)
Calcium: 9.1 mg/dL (ref 8.9–10.3)
Chloride: 105 mmol/L (ref 98–111)
Creatinine, Ser: 0.75 mg/dL (ref 0.44–1.00)
GFR calc Af Amer: >60 mL/min (ref >60)
GFR calc non Af Amer: >60 mL/min (ref >60)
Glucose, Bld: 87 mg/dL (ref 70–99)
Potassium: 3.8 mmol/L (ref 3.5–5.1)
Sodium: 139 mmol/L (ref 135–145)

## 2019-12-07 MED ORDER — HYDROCODONE-ACETAMINOPHEN 5-325 MG PO TABS: 2.0000 | ORAL_TABLET | Freq: Four times a day (QID) | ORAL | 0 refills | Status: AC | PRN

## 2019-12-07 MED ORDER — HYDROCODONE-ACETAMINOPHEN 5-325 MG PO TABS: 2.0000 | ORAL_TABLET | Freq: Four times a day (QID) | ORAL | 0 refills | Status: DC | PRN

## 2019-12-07 MED ORDER — HYDROCODONE-ACETAMINOPHEN 5-325 MG PO TABS
1.0000 | ORAL_TABLET | Freq: Once | ORAL | Status: AC
  Administered 2019-12-07: 1 via ORAL
  Filled 2019-12-07: qty 1

## 2019-12-07 NOTE — ED Triage Notes (Signed)
Pt states that she took 800 mg ibuprofen and ice PTA.

## 2019-12-07 NOTE — ED Provider Notes (Signed)
[facility] EMERGENCY DEPARTMENT Provider Note   CSN: [id number] Arrival date & time: 12/07/19  1836     History Chief Complaint  Patient presents with  . Leg Pain    Katie Dorsey is a 34 y.o. female who presents to the ED today complaining of sudden onset, constant, sharp, 10/10, left leg pain that began around 4:30 PM today.  She reports that she was getting her baby out of the car seat when she felt like the car seat was going to fall out of the car.  She attempted to catch the car seat and stepped onto her left leg to ground herself when she felt an immediate pop in her calf.  Patient states that since then she has had severe pain to the area.  She did take 800 mg of ibuprofen approximately 30 minutes later with mild relief however states that she is having difficulty bearing weight on the leg due to pain.  He denies any history of DVT/PE.  No recent prolonged travel or immobilization.  Patient is currently on the minipill OCP.  She did recently have a child 6 months ago and is breast-feeding.  Denies any recent antibiotic use. No fevers or chills.   The history is provided by the patient and medical records.       Past Medical History:  Diagnosis Date  . Abnormal Pap smear 2006   "mild dysplasia", colpo negative  . Anxiety   . Asthma, exercise induced   . Complication of anesthesia   . Depression   . Ovarian cyst rupture 2012  . PCOS (polycystic ovarian syndrome) 2011  . Status post repeat low transverse cesarean section 06/07/2019  . Vaginal Pap smear, abnormal     Patient Active Problem List   Diagnosis Date Noted  . Status post repeat low transverse cesarean section 06/07/2019  . Term pregnancy 06/07/2019  . Postpartum care following cesarean delivery 06/07/2019  . Gestational hypertension 09/18/2014  . [redacted] weeks gestation of pregnancy   . Encounter for routine screening for malformation using ultrasonics   . Gestational HTN 08/18/2014    Past Surgical  History:  Procedure Laterality Date  . CESAREAN SECTION N/A 09/19/2014   Procedure: CESAREAN SECTION;  Surgeon: Leslie Andrea, MD;  Location: [facility] ORS;  Service: Obstetrics;  Laterality: N/A;  . CESAREAN SECTION N/A 06/07/2019   Procedure: CESAREAN SECTION;  Surgeon: Edwinna Areola, DO;  Location: [facility] ORS;  Service: Obstetrics;  Laterality: N/A;  request RNFA  . COLPOSCOPY  2006   negative  . GANGLION CYST EXCISION Right 2004   wrist  . TONSILLECTOMY  1995     OB History    Gravida  2   Para  1   Term  1   Preterm      AB      Living  1     SAB      TAB      Ectopic      Multiple  0   Live Births  1           Family History  Problem Relation Age of Onset  . Hypertension Mother   . HIV Father   . Colon cancer Maternal Grandmother   . Heart disease Maternal Grandfather   . Hyperlipidemia Maternal Grandfather   . Diabetes Maternal Uncle   . Diabetes Maternal Uncle   . Cancer Maternal Aunt     Social History   Tobacco Use  . Smoking  status: Former Smoker    Packs/day: 0.25    Years: 13.00    Pack years: 3.25    Types: Cigarettes    Quit date: 10/20/2012    Years since quitting: 7.1  . Smokeless tobacco: Never Used  Substance Use Topics  . Alcohol use: No  . Drug use: No    Home Medications Prior to Admission medications   Medication Sig Start Date End Date Taking? Authorizing Provider  ferrous sulfate 325 (65 FE) MG EC tablet Take 325 mg by mouth every other day. In the morning    [provider]  HYDROcodone-acetaminophen (NORCO/VICODIN) 5-325 MG tablet Take 2 tablets by mouth every 6 (six) hours as needed for severe pain. 12/07/19   Tanda Rockers, PA-C  ibuprofen (ADVIL) 800 MG tablet Take 1 tablet (800 mg total) by mouth every 8 (eight) hours as needed for cramping. 06/09/19   Banga, Sharol Given, DO  prenatal vitamin w/FE, FA (PRENATAL 1 + 1) 27-1 MG TABS tablet Take 1 tablet by mouth daily.     [provider]    sertraline (ZOLOFT) 50 MG tablet Take 25 mg by mouth daily.     [provider]    Allergies    Mango flavor, Penicillins, Mucinex [guaifenesin er], and Keflex [cephalexin]  Review of Systems   Review of Systems  Constitutional: Negative for chills and fever.  Respiratory: Negative for shortness of breath.   Cardiovascular: Negative for chest pain.  Musculoskeletal: Positive for arthralgias.  All other systems reviewed and are negative.   Physical Exam Updated Vital Signs BP (!) 148/98 (BP Location: Left Arm)   Pulse 87   Temp 98.4 F (36.9 C) (Oral)   Resp 18   Ht 5\' 8"  (1.727 m)   Wt 115.7 kg   SpO2 97%   Breastfeeding Yes   BMI 38.77 kg/m   Physical Exam Vitals and nursing note reviewed.  Constitutional:      Appearance: She is not ill-appearing or diaphoretic.  HENT:     Head: Normocephalic and atraumatic.  Eyes:     Conjunctiva/sclera: Conjunctivae normal.  Cardiovascular:     Rate and Rhythm: Normal rate and regular rhythm.     Pulses: Normal pulses.  Pulmonary:     Effort: Pulmonary effort is normal.     Breath sounds: Normal breath sounds. No wheezing, rhonchi or rales.  Abdominal:     Palpations: Abdomen is soft.     Tenderness: There is no abdominal tenderness.  Musculoskeletal:     Cervical back: Neck supple.     Comments: No obvious swelling of LLE compared to RLE. No ecchymosis noted to posterior calf. No deformity noted to achilles tendon. Negative Thompson's sign. + TTP to posterior LLE along calf muscle. ROM limited with dorsiflexion and plantarflexion of left foot. Able to flex knee however endorses pain to the calf with this motion. 2+ DP and PT pulse.   Skin:    General: Skin is warm and dry.  Neurological:     Mental Status: She is alert.     ED Results / Procedures / Treatments   Labs (all labs ordered are listed, but only abnormal results are displayed) Labs Reviewed  BASIC METABOLIC PANEL  CBC WITH DIFFERENTIAL/PLATELET     EKG None  Radiology Venous Img Lower  Left (DVT Study)  Result Date: 12/07/2019 CLINICAL DATA:  Pop in left calf followed by severe pain, cramping sensation, unable to bear weight on a and can EXAM: LEFT  LOWER EXTREMITY VENOUS DOPPLER ULTRASOUND TECHNIQUE: Gray-scale sonography with compression, as well as color and duplex ultrasound, were performed to evaluate the deep venous system(s) from the level of the common femoral vein through the popliteal and proximal calf veins. COMPARISON:  None. FINDINGS: VENOUS Normal compressibility of the common femoral, superficial femoral, and popliteal veins. Limited evaluation of the calf veins secondary to patient discomfort and pain with intolerance of compression imaging. Normal color flow is visualized. Visualized portions of profunda femoral vein and great saphenous vein unremarkable. No filling defects to suggest DVT on grayscale or color Doppler imaging. Doppler waveforms show normal direction of venous flow, normal respiratory phasicity and response to augmentation. Limited views of the contralateral common femoral vein are unremarkable. OTHER There is a small amount of heterogeneous fluid noted in the posteromedial calf likely subjacent to what is likely the gastrocnemius. This correlates well with patient's indicated area of pain. Limitations: none IMPRESSION: No femoropopliteal DVT nor evidence of DVT within the visualized calf veins. If clinical symptoms are inconsistent or if there are persistent or worsening symptoms, further imaging (possibly involving the iliac veins) may be warranted. Heterogeneous fluid in the posteromedial calf subjacent to what appears to be to gastrocnemius muscle belly. Given clinical history, findings are suspicious for a myofascial or tendinous injury within the superficial posterior compartment of the leg. Possibly a 'tennis leg' injury typically postulated to reflect some partial tearing at the myotendinous junction of  the gastrocnemius or a plantaris tendon rupture. These results were called by telephone at the time of interpretation on 12/07/2019 at 8:19 pm to provider Dr. Particia Nearing, who verbally acknowledged these results. Electronically Signed   By: Kreg Shropshire M.D.   On: 12/07/2019 20:20    Procedures Procedures (including critical care time)  Medications Ordered in ED Medications  HYDROcodone-acetaminophen (NORCO/VICODIN) 5-325 MG per tablet 1 tablet (1 tablet Oral Given 12/07/19 2015)    ED Course  I have reviewed the triage vital signs and the nursing notes.  Pertinent labs & imaging results that were available during my care of the patient were reviewed by me and considered in my medical decision making (see chart for details).  34 year old female who presents to the ED today complaint of sudden onset left calf pain after she was catching her baby carrier from the car.  Arrival to the ED patient appears uncomfortable.  She is afebrile, nontachycardic and nontachypneic.  Has no obvious swelling of the left lower extremity compared to the right however she does have a significant tenderness to palpation to the posterior calf.  Obtain ultrasound at this time to rule out hematoma as well as DVT.  Screening labs obtained in case patient does have DVT and is to start DOAC's.   CBC without leukocytosis. Hgb stable.  No electrolyte abnormalities.   Ultrasound negative for DVT however does show concern for muscle/tendon rupture of the gastrocnemius muscle. After ultrasound pt began having significant pain, Norco given. Will provide crutches and ace wrap at this time and have pt follow up with orthopedics. Will prescribe short course of pain meds for patient. She is advised to discard of breast milk while on this medication. Strict return precautions have been discussed with pt. She is in agreement with plan and stable for discharge home.   This note was prepared using Dragon voice recognition software and may  include unintentional dictation errors due to the inherent limitations of voice recognition software.    MDM Rules/Calculators/A&P  Final Clinical Impression(s) / ED Diagnoses Final diagnoses:  Gastrocnemius muscle tear, left, initial encounter  Left leg pain    Rx / DC Orders ED Discharge Orders         Ordered    HYDROcodone-acetaminophen (NORCO/VICODIN) 5-325 MG tablet  Every 6 hours PRN     12/07/19 2037           Discharge Instructions     Please call Dr. Sid Falcon office on Monday to schedule an appointment to be seen   While at home please rest, ice, and elevate your leg on 2-3 pillows to reduce swelling/inflammation  Please pick up pain medication and take as needed. I would recommend taking Ibuprofen first and if you have break through pain to take the narcotic pain medication. You will need to pump and discard of breastmilk while on this pain medication. I would recommend using your preserves or using formula while on this medication   Use crutches to ambulate. Do not bare weight onto your leg until you are seen by ortho  Return to the ED for any worsening symptoms including worsening pain, worsening swelling, redness to your leg, chest pain, shortness of breath, or any other concerning symptoms        Eustaquio Maize, Hershal Coria 12/07/19 2045    Isla Pence, MD 12/07/19 2154

## 2019-12-07 NOTE — ED Notes (Signed)
Pt's pharmacy updated at her request to [facility] on [facility]

## 2019-12-07 NOTE — Discharge Instructions (Signed)
Please call Dr. Kathline Magic office on Monday to schedule an appointment to be seen   While at home please rest, ice, and elevate your leg on 2-3 pillows to reduce swelling/inflammation  Please pick up pain medication and take as needed. I would recommend taking Ibuprofen first and if you have break through pain to take the narcotic pain medication. You will need to pump and discard of breastmilk while on this pain medication. I would recommend using your preserves or using formula while on this medication   Use crutches to ambulate. Do not bare weight onto your leg until you are seen by ortho  Return to the ED for any worsening symptoms including worsening pain, worsening swelling, redness to your leg, chest pain, shortness of breath, or any other concerning symptoms

## 2019-12-07 NOTE — ED Triage Notes (Signed)
Pt states that she went to catch a car seat that was falling and felt a pop in the back of her left calf today. Pt states that she is unable to put any weight on her left leg.

## 2020-08-06 IMAGING — CT CT RENAL STONE PROTOCOL
2 of 4 series · 17 of 46 positions shown, 19 images · non-contrast
Comparison: None.

CLINICAL DATA: LEFT lower quadrant pain. Back pain. Dark urine.
History of ovarian cysts.

EXAM:
CT ABDOMEN AND PELVIS WITHOUT CONTRAST
TECHNIQUE: Multidetector CT imaging of the abdomen and pelvis was performed
following the standard protocol without IV contrast.

[Series 3: ap without · axial · non-contrast · 0.83mm/px · z∈[+712,+1172]mm · 14 of 106 slices shown, 16 images]
[im 7/106  soft-tissue]
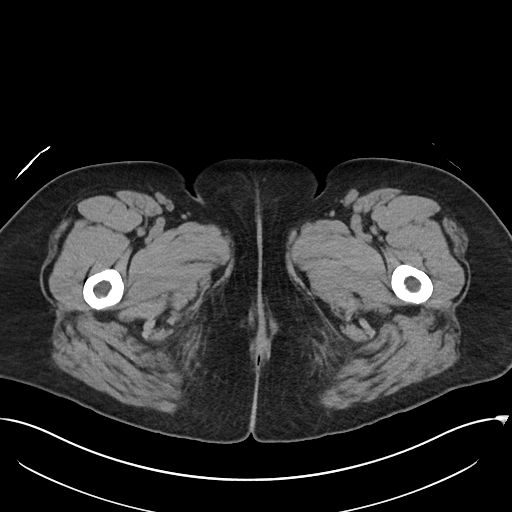
[im 7/106  bone]
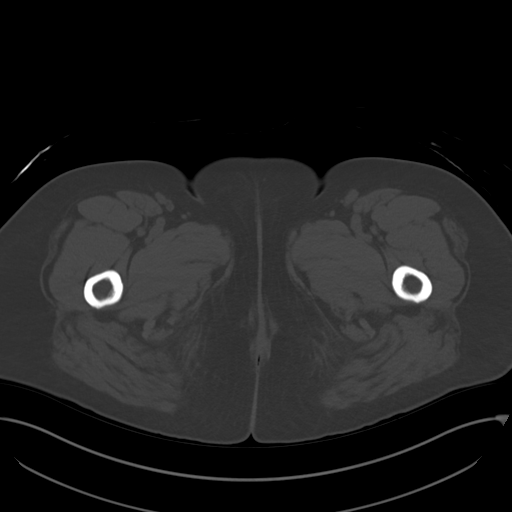
[im 14/106  soft-tissue]
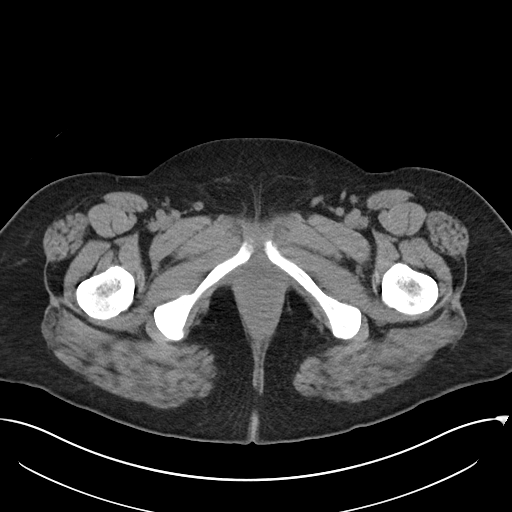
[im 20/106  soft-tissue]
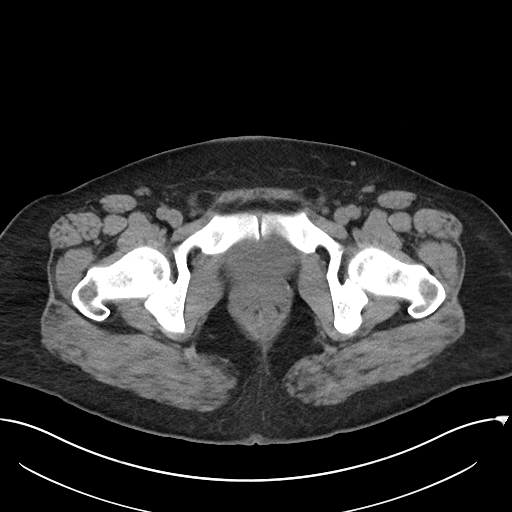
[im 27/106  soft-tissue]
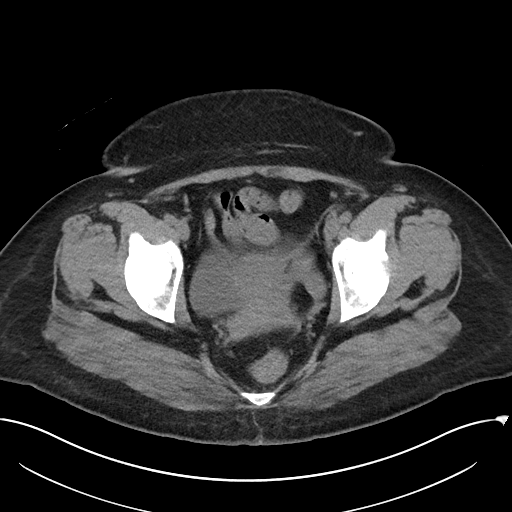
[im 33/106  soft-tissue]
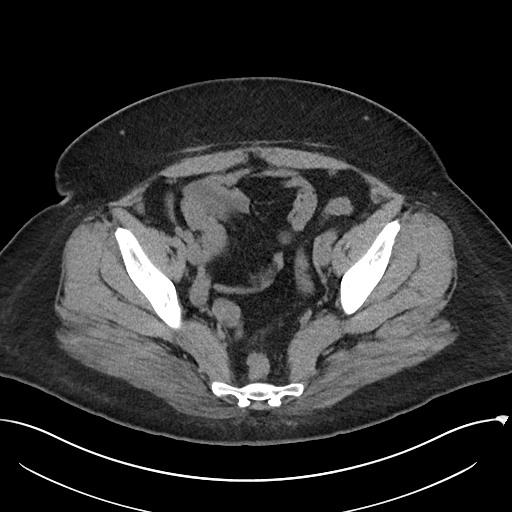
[im 40/106  soft-tissue]
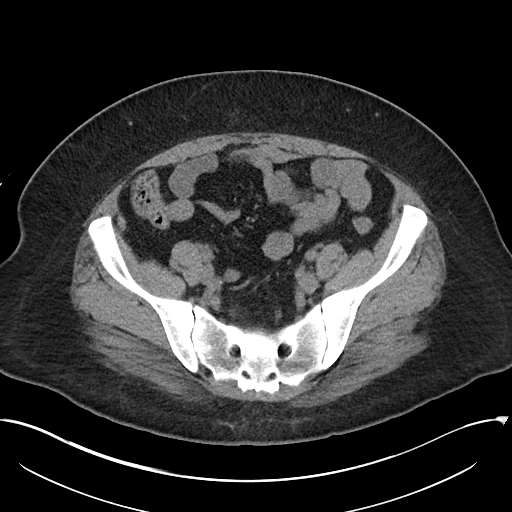
[im 46/106  soft-tissue]
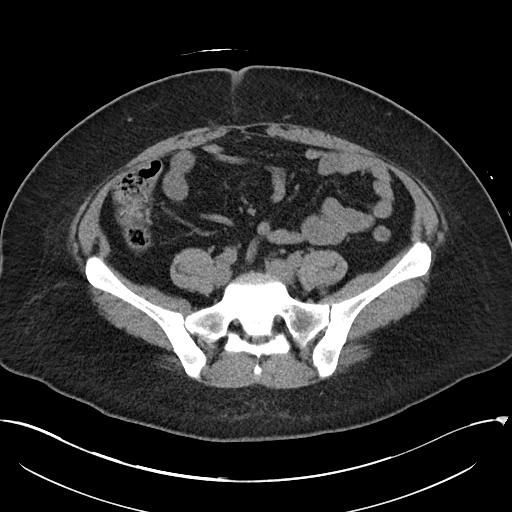
[im 60/106  soft-tissue]
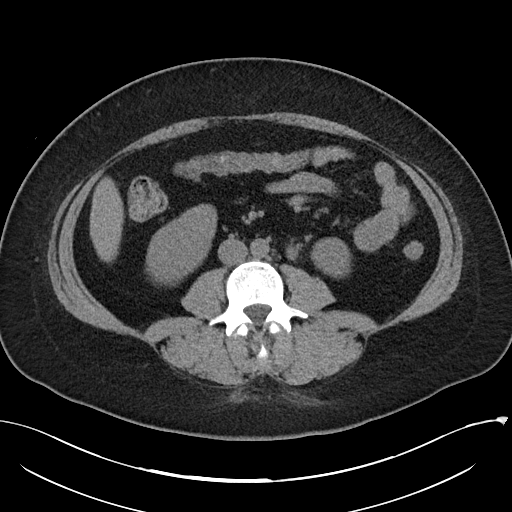
[im 66/106  soft-tissue]
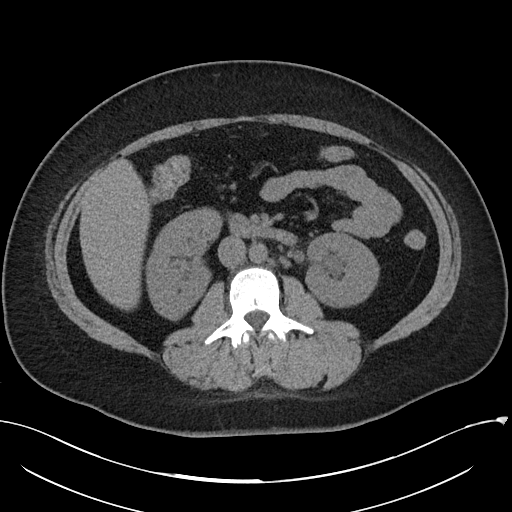
[im 66/106  bone]
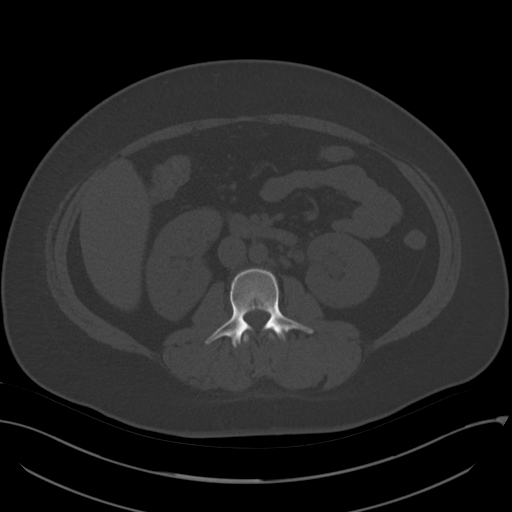
[im 73/106  soft-tissue]
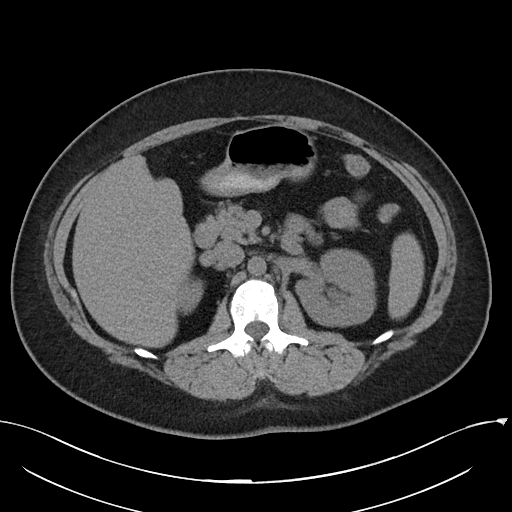
[im 79/106  soft-tissue]
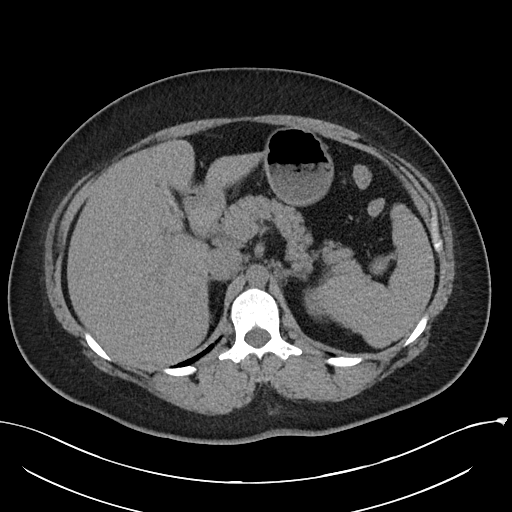
[im 86/106  soft-tissue]
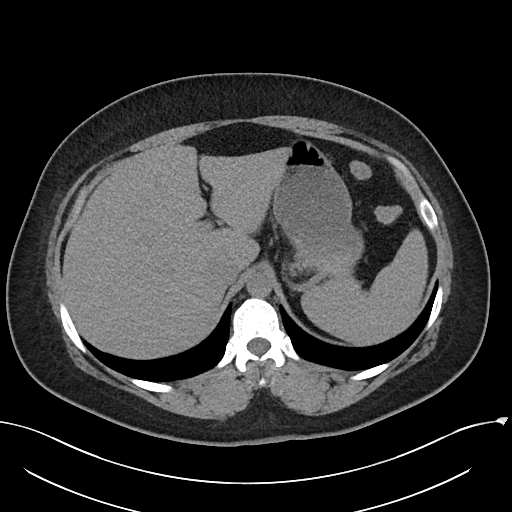
[im 92/106  soft-tissue]
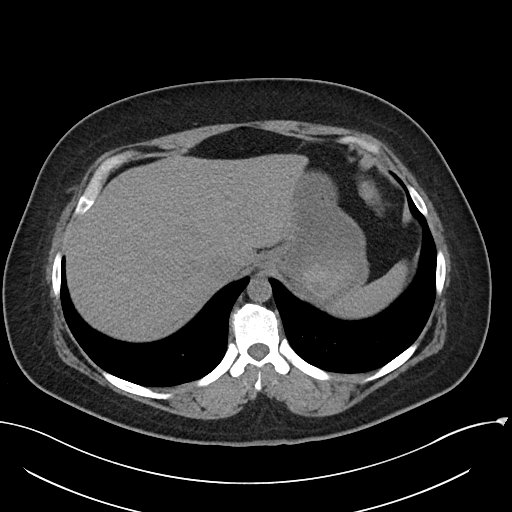
[im 99/106  soft-tissue]
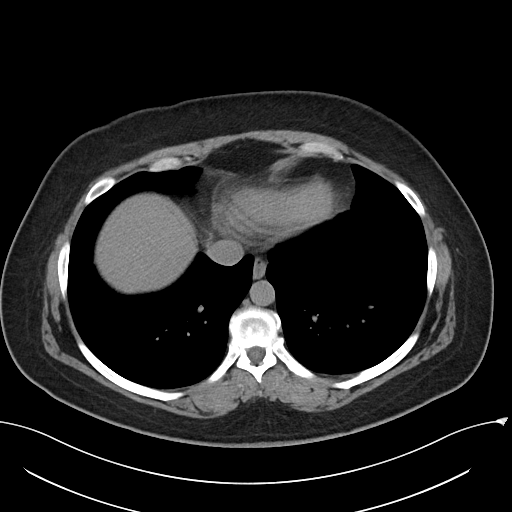

[Series 6: cor · coronal · 0.96mm/px · 3 of 110 slices shown]
[im 37/110  soft-tissue]
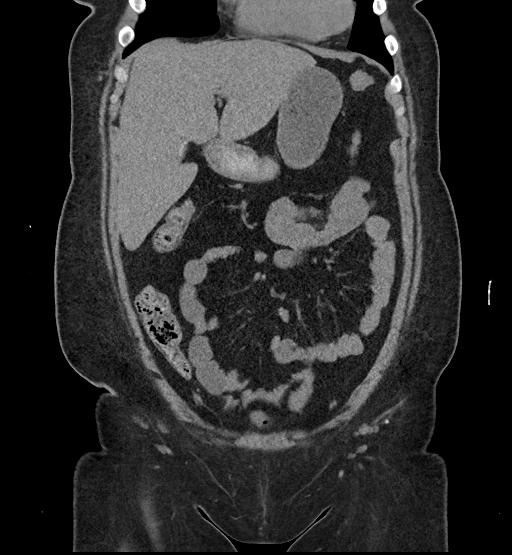
[im 49/110  soft-tissue]
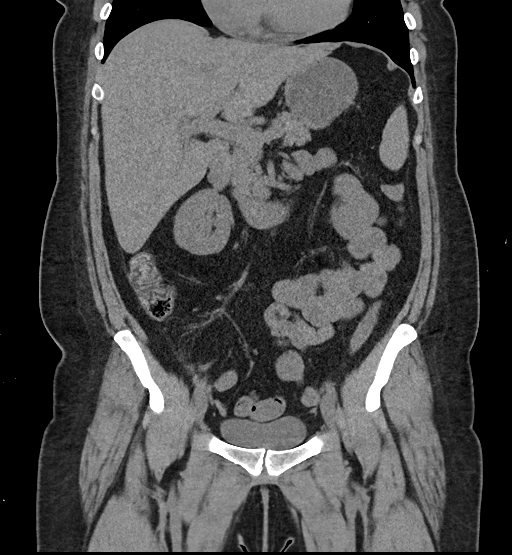
[im 61/110  soft-tissue]
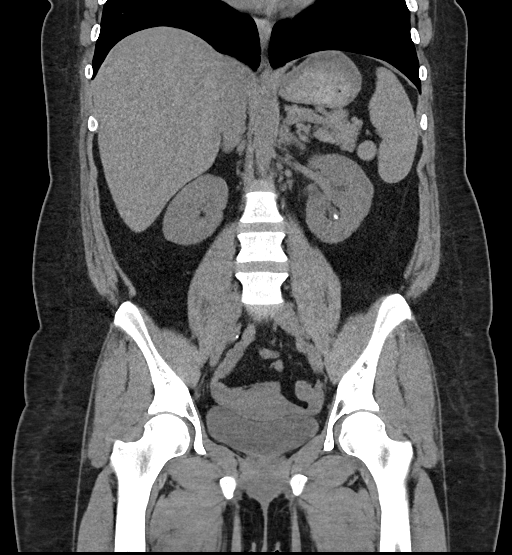

[17 of 46 positions shown; findings below may reference images not displayed]

FINDINGS: Lower chest: Lung bases are clear.

Hepatobiliary: No focal hepatic lesion. Gallbladder collapsed. No
duct dilatation.

Pancreas: Pancreas is normal. No ductal dilatation. No pancreatic
inflammation.

Spleen: Normal spleen

Adrenals/urinary tract: Adrenal glands normal.

Partially obstructing calculus at the LEFT ureteropelvic junction
measures 6 mm (image 43/3). Mild pelvicaliectasis of the LEFT
kidney.

There are two additional 4 mm calculi in lower pole of the LEFT
kidney. The distal LEFT ureter is free of obstruction or calculi.

RIGHT kidney has no calculi. RIGHT ureter normal. No bladder calculi

Stomach/Bowel: Stomach, small bowel, appendix, and cecum are normal.
The colon and rectosigmoid colon are normal.

Vascular/Lymphatic: Abdominal aorta is normal caliber. No periportal
or retroperitoneal adenopathy. No pelvic adenopathy.

Reproductive:

Other: No free fluid. Small umbilical hernia measures 2 cm. Hernia
is fat filled.

Musculoskeletal: No aggressive osseous lesion.
IMPRESSION: 1. Partially obstructing calculus at the LEFT ureteropelvic
junction.
2. LEFT nephrolithiasis.

## 2020-10-20 IMAGING — US US EXTREM LOW VENOUS*L*
1 series · 13 of 24 positions shown · non-contrast
Comparison: None.

CLINICAL DATA: Pop in left calf followed by severe pain, cramping
sensation, unable to bear weight on a and can

EXAM:
LEFT LOWER EXTREMITY VENOUS DOPPLER ULTRASOUND
TECHNIQUE: Gray-scale sonography with compression, as well as color and duplex
ultrasound, were performed to evaluate the deep venous system(s)
from the level of the common femoral vein through the popliteal and
proximal calf veins.

[Series 1: us extrem low venous*left* · 13 of 57 slices shown]
[im 1/57]
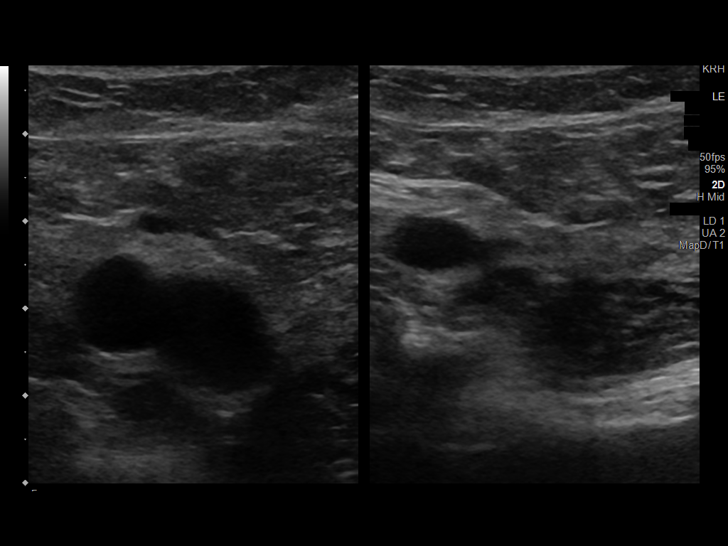
[im 5/57]
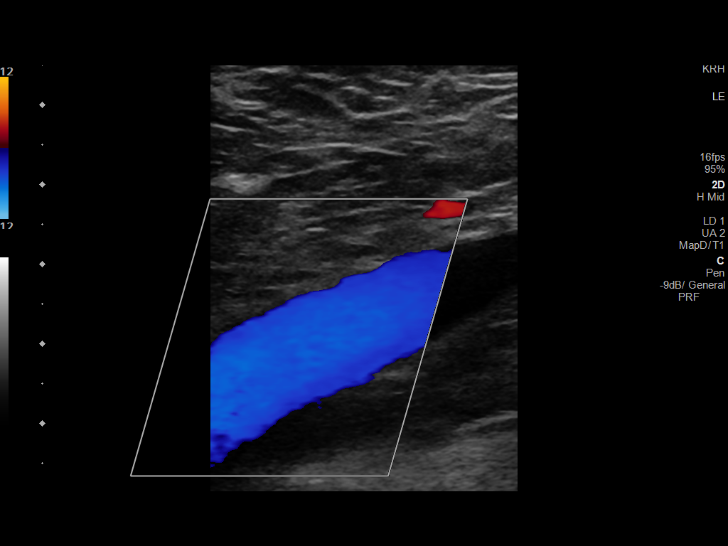
[im 10/57]
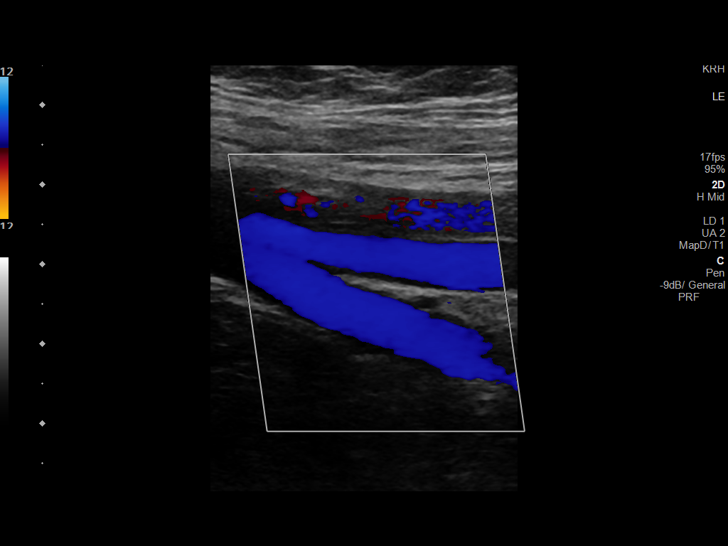
[im 15/57]
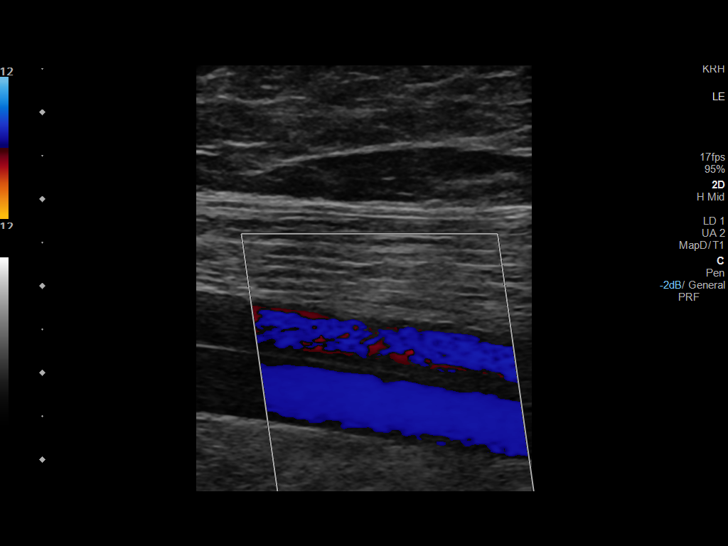
[im 20/57]
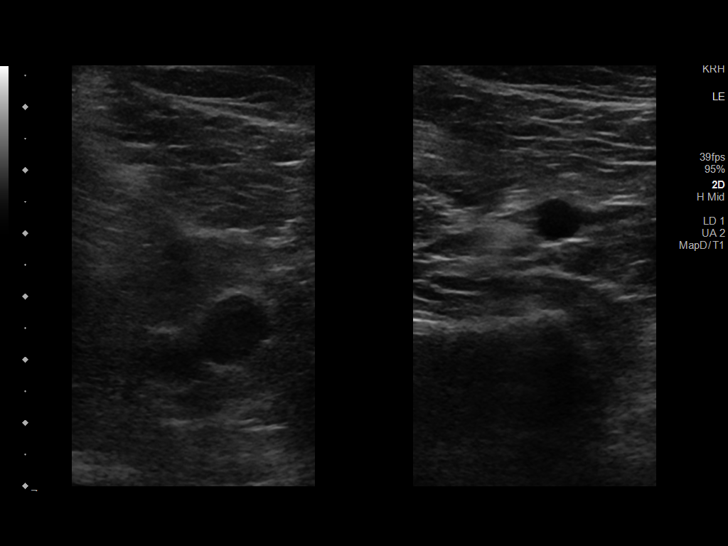
[im 25/57]
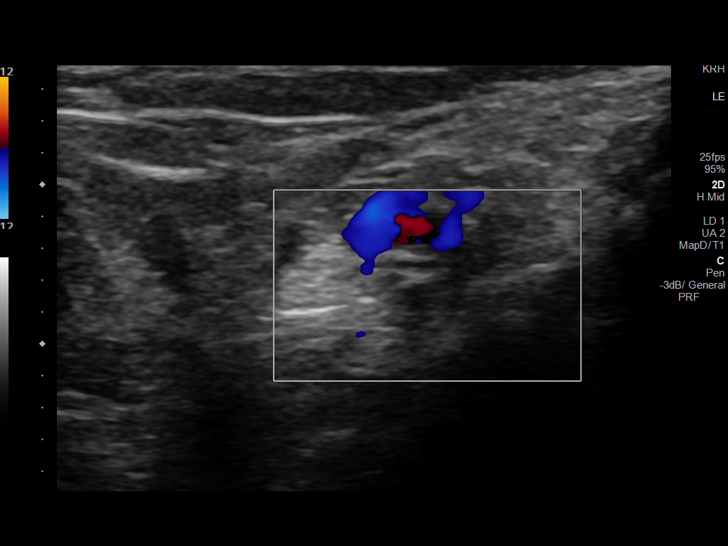
[im 30/57]
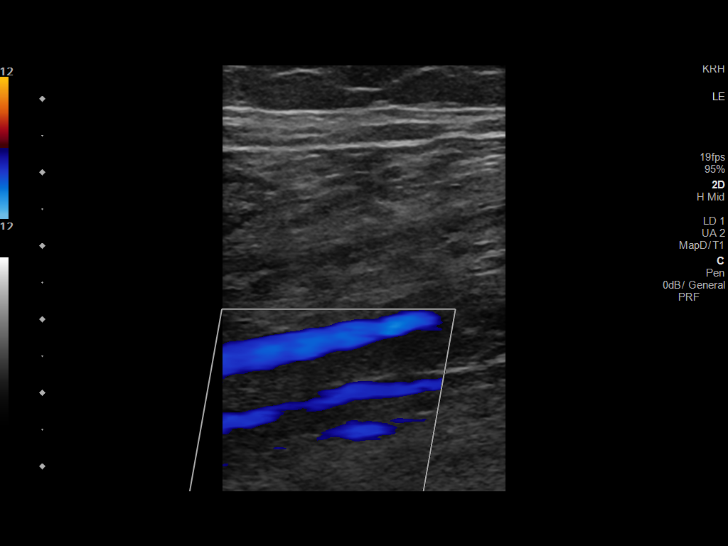
[im 32/57]
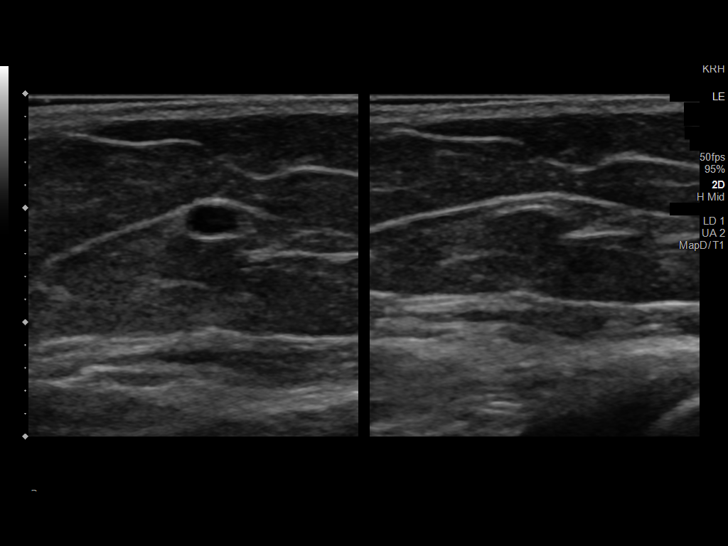
[im 37/57]
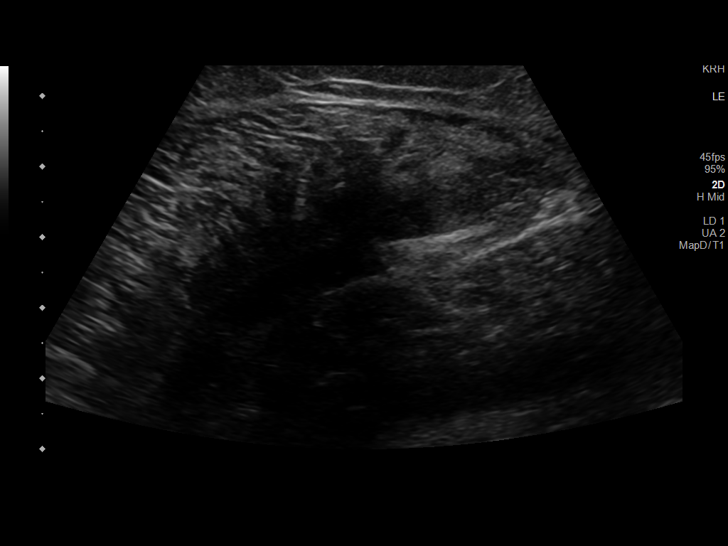
[im 42/57]
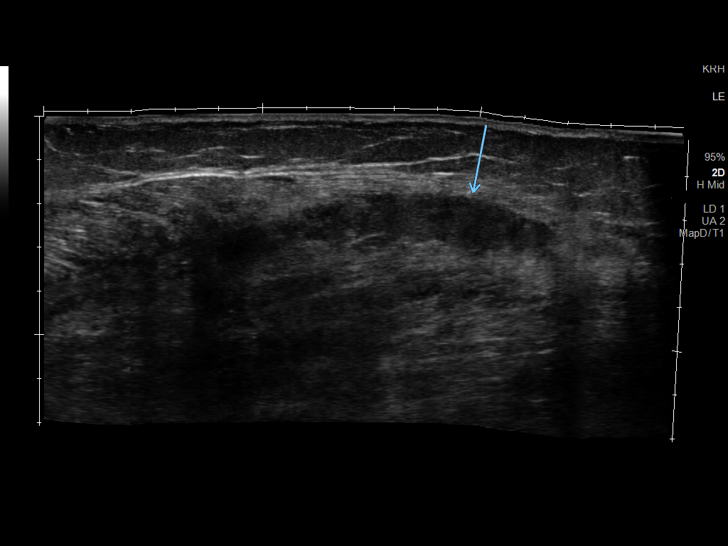
[im 47/57]
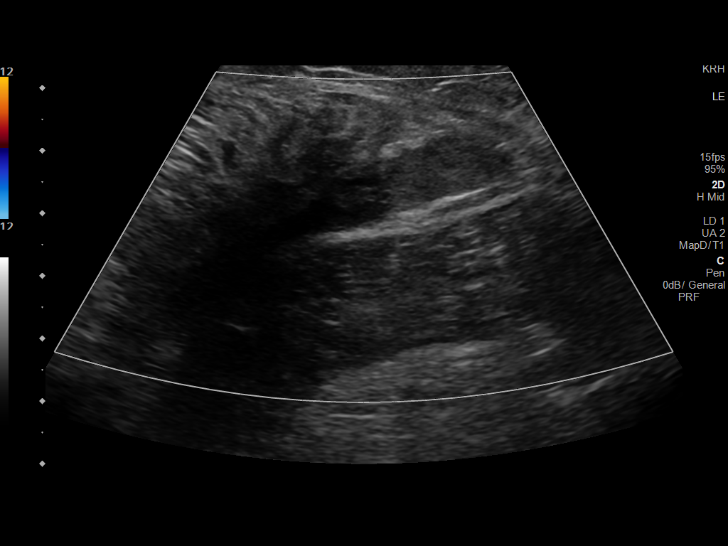
[im 52/57]
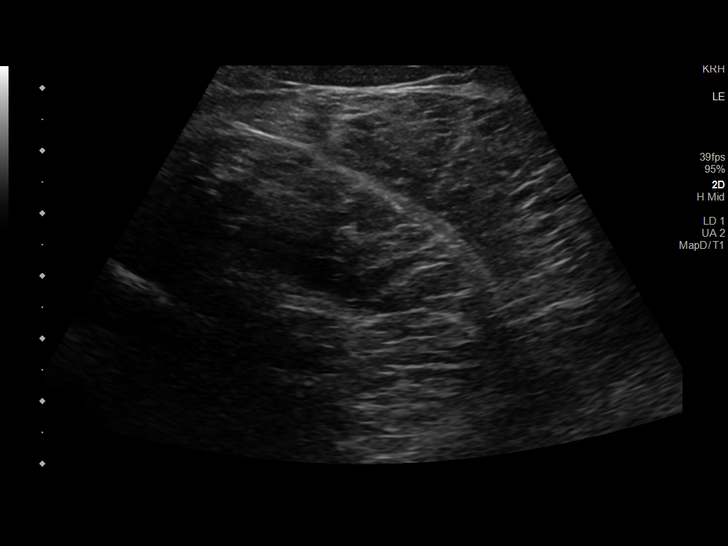
[im 57/57]
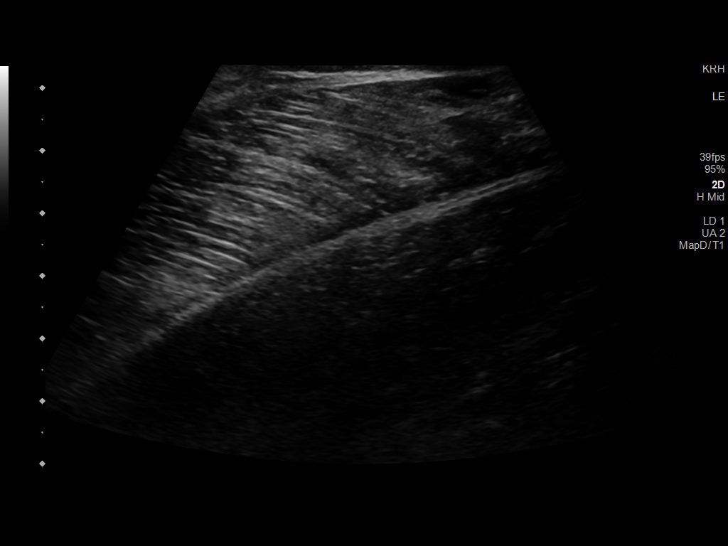

[13 of 24 positions shown; findings below may reference images not displayed]

FINDINGS: VENOUS

Normal compressibility of the common femoral, superficial femoral,
and popliteal veins. Limited evaluation of the calf veins secondary
to patient discomfort and pain with intolerance of compression
imaging. Normal color flow is visualized. Visualized portions of
profunda femoral vein and great saphenous vein unremarkable. No
filling defects to suggest DVT on grayscale or color Doppler
imaging. Doppler waveforms show normal direction of venous flow,
normal respiratory phasicity and response to augmentation.

Limited views of the contralateral common femoral vein are
unremarkable.

OTHER

There is a small amount of heterogeneous fluid noted in the
posteromedial calf likely subjacent to what is likely the
gastrocnemius. This correlates well with patient's indicated area of
pain.

Limitations: none
IMPRESSION: No femoropopliteal DVT nor evidence of DVT within the visualized
calf veins.

If clinical symptoms are inconsistent or if there are persistent or
worsening symptoms, further imaging (possibly involving the iliac
veins) may be warranted.

Heterogeneous fluid in the posteromedial calf subjacent to what
appears to be to gastrocnemius muscle belly. Given clinical history,
findings are suspicious for a myofascial or tendinous injury within
the superficial posterior compartment of the leg. Possibly a 'tennis
leg' injury typically postulated to reflect some partial tearing at
the myotendinous junction of the gastrocnemius or a plantaris tendon
rupture.

These results were called by telephone at the time of interpretation
on 12/07/2019 at [DATE] to provider Dr. Kevin Oscar, who verbally
acknowledged these results.

## 2020-11-20 ENCOUNTER — Ambulatory Visit: Payer: BC Managed Care – PPO | Attending: Internal Medicine

## 2020-11-20 DIAGNOSIS — Z23 Encounter for immunization: Secondary | ICD-10-CM

## 2020-11-20 NOTE — Progress Notes (Signed)
   Covid-19 Vaccination Clinic  Name:  Naveen Lorusso    MRN: [id number] DOB: [birth year]  11/20/2020  Ms. Goens was observed post Covid-19 immunization for 15 minutes without incident. She was provided with Vaccine Information Sheet and instruction to access the V-Safe system.   Ms. Gwynne was instructed to call 911 with any severe reactions post vaccine: Marland Kitchen Difficulty breathing  . Swelling of face and throat  . A fast heartbeat  . A bad rash all over body  . Dizziness and weakness   Immunizations Administered    Name Date Dose VIS Date Route   Moderna Covid-19 Booster Vaccine 11/20/2020  4:36 PM 0.25 mL 07/08/2020 Intramuscular   Manufacturer: Moderna   Lot: [id number]   NDC: 69678-938-10
